# Patient Record
Sex: Male | Born: 1990
Health system: Southern US, Community
[De-identification: ages and names within clinical notes are randomized; demographics above are authoritative.]

## PROBLEM LIST (undated history)

## (undated) DIAGNOSIS — I10 Essential (primary) hypertension: Secondary | ICD-10-CM

---
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
## 2015-01-12 ENCOUNTER — Other Ambulatory Visit (HOSPITAL_COMMUNITY)
Admission: RE | Admit: 2015-01-12 | Discharge: 2015-01-12 | Disposition: A | Discharge status: Home / Self Care | Payer: 59 | Source: Ambulatory Visit | Attending: Internal Medicine | Admitting: Internal Medicine

## 2015-01-12 DIAGNOSIS — I10 Essential (primary) hypertension: Secondary | ICD-10-CM | POA: Insufficient documentation

## 2015-01-12 DIAGNOSIS — N183 Chronic kidney disease, stage 3 (moderate): Secondary | ICD-10-CM | POA: Insufficient documentation

## 2015-01-12 DIAGNOSIS — D751 Secondary polycythemia: Secondary | ICD-10-CM | POA: Insufficient documentation

## 2015-01-26 ENCOUNTER — Emergency Department (HOSPITAL_COMMUNITY): Payer: 59 | Admitting: Anesthesiology

## 2015-01-26 ENCOUNTER — Encounter (HOSPITAL_COMMUNITY)
Admission: EM | Disposition: A | Discharge status: Home / Self Care | Payer: Self-pay | Source: Home / Self Care | Attending: Emergency Medicine

## 2015-01-26 ENCOUNTER — Observation Stay (HOSPITAL_COMMUNITY)
Admission: EM | Admit: 2015-01-26 | Discharge: 2015-01-27 | Disposition: A | Discharge status: Home / Self Care | Payer: 59 | Attending: General Surgery | Admitting: General Surgery

## 2015-01-26 ENCOUNTER — Encounter (HOSPITAL_COMMUNITY): Payer: Self-pay | Admitting: Emergency Medicine

## 2015-01-26 ENCOUNTER — Emergency Department (HOSPITAL_COMMUNITY): Payer: 59

## 2015-01-26 DIAGNOSIS — N189 Chronic kidney disease, unspecified: Secondary | ICD-10-CM | POA: Diagnosis not present

## 2015-01-26 DIAGNOSIS — R1033 Periumbilical pain: Secondary | ICD-10-CM | POA: Diagnosis present

## 2015-01-26 DIAGNOSIS — K3580 Unspecified acute appendicitis: Secondary | ICD-10-CM | POA: Diagnosis not present

## 2015-01-26 DIAGNOSIS — I129 Hypertensive chronic kidney disease with stage 1 through stage 4 chronic kidney disease, or unspecified chronic kidney disease: Secondary | ICD-10-CM | POA: Insufficient documentation

## 2015-01-26 HISTORY — DX: Essential (primary) hypertension: I10

## 2015-01-26 HISTORY — PX: LAPAROSCOPIC APPENDECTOMY: SHX408

## 2015-01-26 LAB — BASIC METABOLIC PANEL
Anion gap: 9 (ref 5–15)
BUN: 12 mg/dL (ref 6–20)
CALCIUM: 9.9 mg/dL (ref 8.9–10.3)
CO2: 30 mmol/L (ref 22–32)
CREATININE: 1.69 mg/dL — AB (ref 0.61–1.24)
Chloride: 102 mmol/L (ref 101–111)
GFR calc Af Amer: >60 mL/min (ref >60)
GFR, EST NON AFRICAN AMERICAN: 55 mL/min — AB (ref >60)
GLUCOSE: 112 mg/dL — AB (ref 65–99)
Potassium: 4.2 mmol/L (ref 3.5–5.1)
Sodium: 141 mmol/L (ref 135–145)

## 2015-01-26 LAB — URINALYSIS, ROUTINE W REFLEX MICROSCOPIC
BILIRUBIN URINE: NEGATIVE
GLUCOSE, UA: NEGATIVE mg/dL
KETONES UR: NEGATIVE mg/dL
Leukocytes, UA: NEGATIVE
Nitrite: NEGATIVE
PH: 8.5 — AB (ref 5.0–8.0)
Protein, ur: >300 mg/dL — AB
Specific Gravity, Urine: 1.02 (ref 1.005–1.030)
Urobilinogen, UA: 0.2 mg/dL (ref 0.0–1.0)

## 2015-01-26 LAB — CBC WITH DIFFERENTIAL/PLATELET
BASOS ABS: 0 10*3/uL (ref 0.0–0.1)
Basophils Relative: 0 %
EOS PCT: 0 %
Eosinophils Absolute: 0 10*3/uL (ref 0.0–0.7)
HCT: 52.4 % — ABNORMAL HIGH (ref 39.0–52.0)
Hemoglobin: 18.4 g/dL — ABNORMAL HIGH (ref 13.0–17.0)
LYMPHS ABS: 1 10*3/uL (ref 0.7–4.0)
LYMPHS PCT: 8 %
MCH: 28 pg (ref 26.0–34.0)
MCHC: 35.1 g/dL (ref 30.0–36.0)
MCV: 79.6 fL (ref 78.0–100.0)
MONO ABS: 0.3 10*3/uL (ref 0.1–1.0)
Monocytes Relative: 3 %
Neutro Abs: 11 10*3/uL — ABNORMAL HIGH (ref 1.7–7.7)
Neutrophils Relative %: 89 %
PLATELETS: 243 10*3/uL (ref 150–400)
RBC: 6.58 MIL/uL — ABNORMAL HIGH (ref 4.22–5.81)
RDW: 14 % (ref 11.5–15.5)
WBC: 12.4 10*3/uL — ABNORMAL HIGH (ref 4.0–10.5)

## 2015-01-26 LAB — URINE MICROSCOPIC-ADD ON

## 2015-01-26 SURGERY — APPENDECTOMY, LAPAROSCOPIC
Anesthesia: General | Site: Abdomen

## 2015-01-26 MED ORDER — ROCURONIUM BROMIDE 50 MG/5ML IV SOLN
INTRAVENOUS | Status: AC
  Filled 2015-01-26: qty 1

## 2015-01-26 MED ORDER — POVIDONE-IODINE 10 % EX OINT
TOPICAL_OINTMENT | CUTANEOUS | Status: AC
  Filled 2015-01-26: qty 1

## 2015-01-26 MED ORDER — DEXAMETHASONE SODIUM PHOSPHATE 4 MG/ML IJ SOLN
INTRAMUSCULAR | Status: AC
  Filled 2015-01-26: qty 2

## 2015-01-26 MED ORDER — FENTANYL CITRATE (PF) 250 MCG/5ML IJ SOLN
INTRAMUSCULAR | Status: AC
  Filled 2015-01-26: qty 25

## 2015-01-26 MED ORDER — DIPHENHYDRAMINE HCL 50 MG/ML IJ SOLN: 12.5000 mg | Freq: Four times a day (QID) | INTRAMUSCULAR | Status: DC | PRN

## 2015-01-26 MED ORDER — MIDAZOLAM HCL 5 MG/5ML IJ SOLN
INTRAMUSCULAR | Status: DC | PRN
  Administered 2015-01-26: 2 mg via INTRAVENOUS

## 2015-01-26 MED ORDER — LACTATED RINGERS IV SOLN
INTRAVENOUS | Status: DC | PRN
  Administered 2015-01-26: 16:00:00 via INTRAVENOUS

## 2015-01-26 MED ORDER — PROPOFOL 10 MG/ML IV BOLUS
INTRAVENOUS | Status: AC
  Filled 2015-01-26: qty 20

## 2015-01-26 MED ORDER — LORAZEPAM 2 MG/ML IJ SOLN
1.0000 mg | INTRAMUSCULAR | Status: DC | PRN
  Administered 2015-01-27: 1 mg via INTRAVENOUS
  Filled 2015-01-26: qty 1

## 2015-01-26 MED ORDER — SUCCINYLCHOLINE CHLORIDE 20 MG/ML IJ SOLN
INTRAMUSCULAR | Status: DC | PRN
  Administered 2015-01-26: 140 mg via INTRAVENOUS

## 2015-01-26 MED ORDER — LACTATED RINGERS IV SOLN
INTRAVENOUS | Status: DC
  Administered 2015-01-26: via INTRAVENOUS

## 2015-01-26 MED ORDER — SODIUM CHLORIDE 0.9 % IR SOLN
Status: DC | PRN
  Administered 2015-01-26: 1000 mL

## 2015-01-26 MED ORDER — KETOROLAC TROMETHAMINE 30 MG/ML IJ SOLN
30.0000 mg | Freq: Once | INTRAMUSCULAR | Status: AC
  Administered 2015-01-26: 30 mg via INTRAVENOUS
  Filled 2015-01-26: qty 1

## 2015-01-26 MED ORDER — ROCURONIUM BROMIDE 100 MG/10ML IV SOLN
INTRAVENOUS | Status: DC | PRN
  Administered 2015-01-26: 10 mg via INTRAVENOUS
  Administered 2015-01-26: 15 mg via INTRAVENOUS
  Administered 2015-01-26: 5 mg via INTRAVENOUS

## 2015-01-26 MED ORDER — ACETAMINOPHEN 650 MG RE SUPP: 650.0000 mg | Freq: Four times a day (QID) | RECTAL | Status: DC | PRN

## 2015-01-26 MED ORDER — LIDOCAINE HCL (PF) 1 % IJ SOLN
INTRAMUSCULAR | Status: AC
  Filled 2015-01-26: qty 5

## 2015-01-26 MED ORDER — GLYCOPYRROLATE 0.2 MG/ML IJ SOLN
INTRAMUSCULAR | Status: AC
  Filled 2015-01-26: qty 3

## 2015-01-26 MED ORDER — DEXAMETHASONE SODIUM PHOSPHATE 4 MG/ML IJ SOLN
INTRAMUSCULAR | Status: DC | PRN
  Administered 2015-01-26: 8 mg via INTRAVENOUS

## 2015-01-26 MED ORDER — ONDANSETRON 4 MG PO TBDP: 4.0000 mg | ORAL_TABLET | Freq: Four times a day (QID) | ORAL | Status: DC | PRN

## 2015-01-26 MED ORDER — ACETAMINOPHEN 325 MG PO TABS: 650.0000 mg | ORAL_TABLET | Freq: Four times a day (QID) | ORAL | Status: DC | PRN

## 2015-01-26 MED ORDER — NEOSTIGMINE METHYLSULFATE 10 MG/10ML IV SOLN
INTRAVENOUS | Status: AC
  Filled 2015-01-26: qty 1

## 2015-01-26 MED ORDER — BUPIVACAINE HCL (PF) 0.5 % IJ SOLN
INTRAMUSCULAR | Status: AC
  Filled 2015-01-26: qty 30

## 2015-01-26 MED ORDER — BUPIVACAINE HCL (PF) 0.5 % IJ SOLN
INTRAMUSCULAR | Status: DC | PRN
  Administered 2015-01-26: 10 mL

## 2015-01-26 MED ORDER — POVIDONE-IODINE 10 % OINT PACKET
TOPICAL_OINTMENT | CUTANEOUS | Status: DC | PRN
  Administered 2015-01-26: 1 via TOPICAL

## 2015-01-26 MED ORDER — ONDANSETRON HCL 4 MG/2ML IJ SOLN: 4.0000 mg | Freq: Four times a day (QID) | INTRAMUSCULAR | Status: DC | PRN

## 2015-01-26 MED ORDER — OXYCODONE-ACETAMINOPHEN 5-325 MG PO TABS: 1.0000 | ORAL_TABLET | ORAL | Status: DC | PRN

## 2015-01-26 MED ORDER — NEOSTIGMINE METHYLSULFATE 10 MG/10ML IV SOLN
INTRAVENOUS | Status: DC | PRN
  Administered 2015-01-26: 4 mg via INTRAVENOUS

## 2015-01-26 MED ORDER — ONDANSETRON HCL 4 MG/2ML IJ SOLN
INTRAMUSCULAR | Status: DC | PRN
  Administered 2015-01-26: 4 mg via INTRAVENOUS

## 2015-01-26 MED ORDER — HYDRALAZINE HCL 20 MG/ML IJ SOLN: 10.0000 mg | INTRAMUSCULAR | Status: DC | PRN

## 2015-01-26 MED ORDER — ONDANSETRON HCL 4 MG/2ML IJ SOLN
INTRAMUSCULAR | Status: AC
Start: 2015-01-26 — End: 2015-01-26
  Filled 2015-01-26: qty 2

## 2015-01-26 MED ORDER — ENOXAPARIN SODIUM 40 MG/0.4ML ~~LOC~~ SOLN: 40.0000 mg | SUBCUTANEOUS | Status: DC

## 2015-01-26 MED ORDER — MIDAZOLAM HCL 2 MG/2ML IJ SOLN
INTRAMUSCULAR | Status: AC
  Filled 2015-01-26: qty 4

## 2015-01-26 MED ORDER — GLYCOPYRROLATE 0.2 MG/ML IJ SOLN
INTRAMUSCULAR | Status: DC | PRN
  Administered 2015-01-26: 0.2 mg via INTRAVENOUS
  Administered 2015-01-26: 0.6 mg via INTRAVENOUS

## 2015-01-26 MED ORDER — GLYCOPYRROLATE 0.2 MG/ML IJ SOLN
INTRAMUSCULAR | Status: AC
  Filled 2015-01-26: qty 1

## 2015-01-26 MED ORDER — ARTIFICIAL TEARS OP OINT
TOPICAL_OINTMENT | OPHTHALMIC | Status: AC
  Filled 2015-01-26: qty 3.5

## 2015-01-26 MED ORDER — FENTANYL CITRATE (PF) 100 MCG/2ML IJ SOLN
INTRAMUSCULAR | Status: DC | PRN
  Administered 2015-01-26 (×5): 50 ug via INTRAVENOUS

## 2015-01-26 MED ORDER — PIPERACILLIN-TAZOBACTAM 3.375 G IVPB 30 MIN
3.3750 g | Freq: Once | INTRAVENOUS | Status: AC
  Administered 2015-01-26: 3.375 g via INTRAVENOUS
  Filled 2015-01-26: qty 50

## 2015-01-26 MED ORDER — SODIUM CHLORIDE 0.9 % IV BOLUS (SEPSIS)
1000.0000 mL | Freq: Once | INTRAVENOUS | Status: AC
  Administered 2015-01-26: 1000 mL via INTRAVENOUS

## 2015-01-26 MED ORDER — HYDROMORPHONE HCL 1 MG/ML IJ SOLN
1.0000 mg | INTRAMUSCULAR | Status: DC | PRN
  Administered 2015-01-26: 1 mg via INTRAVENOUS
  Filled 2015-01-26: qty 1

## 2015-01-26 MED ORDER — LIDOCAINE HCL (CARDIAC) 20 MG/ML IV SOLN
INTRAVENOUS | Status: DC | PRN
  Administered 2015-01-26: 50 mg via INTRAVENOUS

## 2015-01-26 MED ORDER — PIPERACILLIN-TAZOBACTAM 3.375 G IVPB
3.3750 g | Freq: Three times a day (TID) | INTRAVENOUS | Status: DC
  Administered 2015-01-26 – 2015-01-27 (×2): 3.375 g via INTRAVENOUS
  Filled 2015-01-26 (×5): qty 50

## 2015-01-26 MED ORDER — ATROPINE SULFATE 0.4 MG/ML IJ SOLN
INTRAMUSCULAR | Status: AC
  Filled 2015-01-26: qty 1

## 2015-01-26 MED ORDER — SUCCINYLCHOLINE CHLORIDE 20 MG/ML IJ SOLN
INTRAMUSCULAR | Status: AC
  Filled 2015-01-26: qty 1

## 2015-01-26 MED ORDER — DIPHENHYDRAMINE HCL 12.5 MG/5ML PO ELIX: 12.5000 mg | ORAL_SOLUTION | Freq: Four times a day (QID) | ORAL | Status: DC | PRN

## 2015-01-26 MED ORDER — ATROPINE SULFATE 1 MG/ML IJ SOLN
INTRAMUSCULAR | Status: AC
  Filled 2015-01-26: qty 1

## 2015-01-26 SURGICAL SUPPLY — 48 items
BAG HAMPER (MISCELLANEOUS) ×2 IMPLANT
CHLORAPREP W/TINT 26ML (MISCELLANEOUS) ×2 IMPLANT
CLOTH BEACON ORANGE TIMEOUT ST (SAFETY) ×2 IMPLANT
COVER LIGHT HANDLE STERIS (MISCELLANEOUS) ×4 IMPLANT
CUTTER FLEX LINEAR 45M (STAPLE) IMPLANT
CUTTER LINEAR ENDO 35 ART FLEX (STAPLE) IMPLANT
CUTTER LINEAR ENDO 35 ART THIN (STAPLE) ×2 IMPLANT
CUTTER LINEAR ENDO 35 ETS TH (STAPLE) IMPLANT
DECANTER SPIKE VIAL GLASS SM (MISCELLANEOUS) ×2 IMPLANT
DISSECTOR BLUNT TIP ENDO 5MM (MISCELLANEOUS) IMPLANT
ELECT REM PT RETURN 9FT ADLT (ELECTROSURGICAL) ×2
ELECTRODE REM PT RTRN 9FT ADLT (ELECTROSURGICAL) ×1 IMPLANT
FILTER SMOKE EVAC LAPAROSHD (FILTER) ×2 IMPLANT
FORMALIN 10 PREFIL 120ML (MISCELLANEOUS) ×2 IMPLANT
GLOVE BIOGEL PI IND STRL 7.0 (GLOVE) ×2 IMPLANT
GLOVE BIOGEL PI INDICATOR 7.0 (GLOVE) ×2
GLOVE ECLIPSE 7.0 STRL STRAW (GLOVE) ×2 IMPLANT
GLOVE EXAM NITRILE MD LF STRL (GLOVE) ×2 IMPLANT
GLOVE SURG SS PI 7.5 STRL IVOR (GLOVE) ×4 IMPLANT
GOWN STRL REUS W/ TWL XL LVL3 (GOWN DISPOSABLE) ×1 IMPLANT
GOWN STRL REUS W/TWL LRG LVL3 (GOWN DISPOSABLE) ×2 IMPLANT
GOWN STRL REUS W/TWL XL LVL3 (GOWN DISPOSABLE) ×1
INST SET LAPROSCOPIC AP (KITS) ×2 IMPLANT
IV NS IRRIG 3000ML ARTHROMATIC (IV SOLUTION) IMPLANT
KIT ROOM TURNOVER APOR (KITS) ×2 IMPLANT
MANIFOLD NEPTUNE II (INSTRUMENTS) ×2 IMPLANT
NEEDLE INSUFFLATION 14GA 120MM (NEEDLE) ×2 IMPLANT
NS IRRIG 1000ML POUR BTL (IV SOLUTION) ×2 IMPLANT
PACK LAP CHOLE LZT030E (CUSTOM PROCEDURE TRAY) ×2 IMPLANT
PAD ARMBOARD 7.5X6 YLW CONV (MISCELLANEOUS) ×2 IMPLANT
POUCH SPECIMEN RETRIEVAL 10MM (ENDOMECHANICALS) ×2 IMPLANT
RELOAD /EVU35 (ENDOMECHANICALS) IMPLANT
RELOAD 45 VASCULAR/THIN (ENDOMECHANICALS) IMPLANT
RELOAD CUTTER ETS 35MM STAND (ENDOMECHANICALS) IMPLANT
SET BASIN LINEN APH (SET/KITS/TRAYS/PACK) ×2 IMPLANT
SET TUBE IRRIG SUCTION NO TIP (IRRIGATION / IRRIGATOR) IMPLANT
SHEARS HARMONIC ACE PLUS 36CM (ENDOMECHANICALS) ×2 IMPLANT
SPONGE GAUZE 2X2 8PLY STRL LF (GAUZE/BANDAGES/DRESSINGS) ×6 IMPLANT
STAPLER VISISTAT (STAPLE) ×2 IMPLANT
SUT VICRYL 0 UR6 27IN ABS (SUTURE) ×2 IMPLANT
TAPE CLOTH SURG 4X10 WHT LF (GAUZE/BANDAGES/DRESSINGS) ×2 IMPLANT
TRAY FOLEY CATH SILVER 16FR (SET/KITS/TRAYS/PACK) ×2 IMPLANT
TROCAR ENDO BLADELESS 11MM (ENDOMECHANICALS) ×2 IMPLANT
TROCAR ENDO BLADELESS 12MM (ENDOMECHANICALS) ×2 IMPLANT
TROCAR XCEL NON-BLD 5MMX100MML (ENDOMECHANICALS) ×2 IMPLANT
TUBING INSUFFLATION (TUBING) ×2 IMPLANT
WARMER LAPAROSCOPE (MISCELLANEOUS) ×2 IMPLANT
YANKAUER SUCT 12FT TUBE ARGYLE (SUCTIONS) ×2 IMPLANT

## 2015-01-26 NOTE — ED Provider Notes (Signed)
CSN: [id number]     Arrival date & time 01/26/15  1121 History   First MD Initiated Contact with Patient 01/26/15 1352     Chief Complaint  Patient presents with  . Abdominal Pain     (Consider location/radiation/quality/duration/timing/severity/associated sxs/prior Treatment) HPI Comments: The patient is a 24 year old male, he has a benign past medical history, he does not take any medications, he states that he does not take any prescriptions, according to the medical record he has been diagnosed with high blood pressure. He reports that he had abdominal pain that started last night, it is located just below the belly button and above the pelvic bone, it has been 10 out of 10 at the worst, currently a disease.slightly. He has been having normal bowel movements without constipation, diarrhea or bleeding. He has no difficulty urinating, he has no fevers chills nausea or vomiting. He denies prior abdominal surgery. This pain has been constant throughout the evening and this morning.  Patient is a 24 y.o. male presenting with abdominal pain. The history is provided by the patient.  Abdominal Pain   Past Medical History  Diagnosis Date  . Hypertension    History reviewed. No pertinent past surgical history. No family history on file. Social History  Substance Use Topics  . Smoking status: Never Smoker   . Smokeless tobacco: None  . Alcohol Use: 1.2 oz/week    2 Cans of beer per week     Comment: occasional    Review of Systems  Gastrointestinal: Positive for abdominal pain.  All other systems reviewed and are negative.     Allergies  Review of patient's allergies indicates no known allergies.  Home Medications   Prior to Admission medications   Medication Sig Start Date End Date Taking? Authorizing Provider  diphenhydrAMINE (BENADRYL) 25 MG tablet Take 25 mg by mouth every 6 (six) hours as needed.   Yes Historical Provider, MD  enalapril (VASOTEC) 20 MG tablet Take 40 mg by  mouth at bedtime.   Yes Historical Provider, MD   BP 164/97 mmHg  Pulse 70  Temp(Src) 97.3 F (36.3 C) (Oral)  Resp 20  Ht  (1.727 m)  Wt 201 lb (91.173 kg)  BMI 30.57 kg/m2  SpO2 100% Physical Exam  Constitutional: He appears well-developed and well-nourished. No distress.  HENT:  Head: Normocephalic and atraumatic.  Mouth/Throat: Oropharynx is clear and moist. No oropharyngeal exudate.  Eyes: Conjunctivae and EOM are normal. Pupils are equal, round, and reactive to light. Right eye exhibits no discharge. Left eye exhibits no discharge. No scleral icterus.  Neck: Normal range of motion. Neck supple. No JVD present. No thyromegaly present.  Cardiovascular: Normal rate, regular rhythm, normal heart sounds and intact distal pulses.  Exam reveals no gallop and no friction rub.   No murmur heard. Pulmonary/Chest: Effort normal and breath sounds normal. No respiratory distress. He has no wheezes. He has no rales.  Abdominal: Soft. Bowel sounds are normal. He exhibits no distension and no mass. There is tenderness (focal tenderness to palpation just below the belly button, no other abdominal tenderness, there is no guarding, no peritoneal signs).  Genitourinary:  Normal appearing genitalia, no hernias  Musculoskeletal: Normal range of motion. He exhibits no edema or tenderness.  Lymphadenopathy:    He has no cervical adenopathy.  Neurological: He is alert. Coordination normal.  Skin: Skin is warm and dry. No rash noted. No erythema.  Psychiatric: He has a normal mood and affect. His behavior is  normal.  Nursing note and vitals reviewed.   ED Course  Procedures (including critical care time) Labs Review Labs Reviewed  CBC WITH DIFFERENTIAL/PLATELET - Abnormal; Notable for the following:    WBC 12.4 (*)    RBC 6.58 (*)    Hemoglobin 18.4 (*)    HCT 52.4 (*)    Neutro Abs 11.0 (*)    All other components within normal limits  BASIC METABOLIC PANEL - Abnormal; Notable for the  following:    Glucose, Bld 112 (*)    Creatinine, Ser 1.69 (*)    GFR calc non Af Amer 55 (*)    All other components within normal limits  URINALYSIS, ROUTINE W REFLEX MICROSCOPIC (NOT AT [facility])    Imaging Review Ct Abdomen Pelvis Wo Contrast  01/26/2015  CLINICAL DATA:  Abdominal pain for 1 day EXAM: CT ABDOMEN AND PELVIS WITHOUT CONTRAST TECHNIQUE: Multidetector CT imaging of the abdomen and pelvis was performed following the standard protocol without oral or intravenous contrast material administration. COMPARISON:  None. FINDINGS: Lower chest:  Lung bases are clear. Hepatobiliary: No focal liver lesions are identified. The gallbladder wall is not appreciably thickened. There is no biliary duct dilatation. Pancreas: No pancreatic mass or inflammatory focus. Spleen: No splenic lesions appreciable. Adrenals/Urinary Tract: Adrenals appear normal bilaterally. There is no renal mass or hydronephrosis on either side. No renal or ureteral calculus is appreciable on either side. Slight nephrocalcinosis is noted bilaterally. Urinary bladder is midline with wall thickness within normal limits for degree of distention. Stomach/Bowel: There is no bowel wall or mesenteric thickening. No bowel obstruction. No free air or portal venous air. Vascular/Lymph Nodes colon There is no there is no abdominal aortic aneurysm. No vascular lesion is seen on this noncontrast enhanced study. There is no adenopathy in the abdomen or pelvis. Reproductive: Prostate is normal in size and contour. There is no pelvic mass. Other: There are several phleboliths in the appendix. The appendix is distended with a diameter of 14 mm. There is no periappendiceal abscess or mesenteric thickening. There is, however, a small amount of loculated effusion slightly distal to the appendix. Elsewhere no abscess is seen. Musculoskeletal: No blastic or lytic bone lesions. No intramuscular or abdominal wall lesions. IMPRESSION: There are phleboliths in  the appendix with a distended appendix, concerning for early acute appendicitis. A small amount of loculated fluid is seen in the right pelvis slightly inferior to the appendix. No frank abscess. No bowel obstruction.  No abscess elsewhere. No renal or ureteral calculi.  No hydronephrosis. Critical Value/emergent results were called by telephone at the time of interpretation on 01/26/2015 at 2:38 pm to Dr. Markham Jordanavid Zammit , who verbally acknowledged these results. Electronically Signed   By: Bretta BangWilliam  Woodruff III M.D.   On: 01/26/2015 14:38   I have personally reviewed and evaluated these images and lab results as part of my medical decision-making.    MDM   Final diagnoses:  Acute appendicitis, unspecified acute appendicitis type    The patient has mild hypertension, slight leukocytosis present - wound consider UTI vs appy as possiblities, proceed with CT.  CT shows appy,  Labs 2with elevated WBC,  Npo status  D/w General surgery.  Dr. Lovell SheehanJenkins will take to the operating room, Zosyn given  Eber HongBrian Cody Albus, MD 01/26/15 1500

## 2015-01-26 NOTE — H&P (Signed)
Ricardo [name] is an 24 y.o. male.   Chief Complaint: Abdominal pain HPI: Patient is a 23 year old black male who presents to the emergency room with less than a 24-hour history of worsening periumbilical pain. CT scan the abdomen was performed which revealed acute appendicitis. There was no evidence of perforation.  Past Medical History  Diagnosis Date  . Hypertension     History reviewed. No pertinent past surgical history.  History reviewed. No pertinent family history. Social History:  reports that he has never smoked. He does not have any smokeless tobacco history on file. He reports that he drinks about 1.2 oz of alcohol per week. He reports that he does not use illicit drugs.  Allergies: No Known Allergies  Medications Prior to Admission  Medication Sig Dispense Refill  . diphenhydrAMINE (BENADRYL) 25 MG tablet Take 25 mg by mouth every 6 (six) hours as needed.    . enalapril (VASOTEC) 20 MG tablet Take 40 mg by mouth at bedtime.      Results for orders placed or performed during the hospital encounter of 01/26/15 (from the past 48 hour(s))  CBC with Differential     Status: Abnormal   Collection Time: 01/26/15 12:31 PM  Result Value Ref Range   WBC 12.4 (H) 4.0 - 10.5 K/uL   RBC 6.58 (H) 4.22 - 5.81 MIL/uL   Hemoglobin 18.4 (H) 13.0 - 17.0 g/dL   HCT 52.4 (H) 39.0 - 52.0 %   MCV 79.6 78.0 - 100.0 fL   MCH 28.0 26.0 - 34.0 pg   MCHC 35.1 30.0 - 36.0 g/dL   RDW 14.0 11.5 - 15.5 %   Platelets 243 150 - 400 K/uL   Neutrophils Relative % 89 %   Neutro Abs 11.0 (H) 1.7 - 7.7 K/uL   Lymphocytes Relative 8 %   Lymphs Abs 1.0 0.7 - 4.0 K/uL   Monocytes Relative 3 %   Monocytes Absolute 0.3 0.1 - 1.0 K/uL   Eosinophils Relative 0 %   Eosinophils Absolute 0.0 0.0 - 0.7 K/uL   Basophils Relative 0 %   Basophils Absolute 0.0 0.0 - 0.1 K/uL  Basic metabolic panel     Status: Abnormal   Collection Time: 01/26/15 12:31 PM  Result Value Ref Range   Sodium 141 135 - 145 mmol/L   Potassium 4.2 3.5 - 5.1 mmol/L   Chloride 102 101 - 111 mmol/L   CO2 30 22 - 32 mmol/L   Glucose, Bld 112 (H) 65 - 99 mg/dL   BUN 12 6 - 20 mg/dL   Creatinine, Ser 1.69 (H) 0.61 - 1.24 mg/dL   Calcium 9.9 8.9 - 10.3 mg/dL   GFR calc non Af Amer 55 (L) >60 mL/min   GFR calc Af Amer >60 >60 mL/min    Comment: (NOTE) The eGFR has been calculated using the CKD EPI equation. This calculation has not been validated in all clinical situations. eGFR's persistently <60 mL/min signify possible Chronic Kidney Disease.    Anion gap 9 5 - 15  Urinalysis, Routine w reflex microscopic (not at [facility])     Status: Abnormal   Collection Time: 01/26/15  2:04 PM  Result Value Ref Range   Color, Urine YELLOW YELLOW   APPearance CLEAR CLEAR   Specific Gravity, Urine 1.020 1.005 - 1.030   pH 8.5 (H) 5.0 - 8.0   Glucose, UA NEGATIVE NEGATIVE mg/dL   Hgb urine dipstick TRACE (A) NEGATIVE   Bilirubin Urine NEGATIVE NEGATIVE   Ketones, ur  NEGATIVE NEGATIVE mg/dL   Protein, ur >300 (A) NEGATIVE mg/dL   Urobilinogen, UA 0.2 0.0 - 1.0 mg/dL   Nitrite NEGATIVE NEGATIVE   Leukocytes, UA NEGATIVE NEGATIVE  Urine microscopic-add on     Status: None   Collection Time: 01/26/15  2:04 PM  Result Value Ref Range   RBC / HPF 0-2 <3 RBC/hpf   Ct Abdomen Pelvis Wo Contrast  01/26/2015  CLINICAL DATA:  Abdominal pain for 1 day EXAM: CT ABDOMEN AND PELVIS WITHOUT CONTRAST TECHNIQUE: Multidetector CT imaging of the abdomen and pelvis was performed following the standard protocol without oral or intravenous contrast material administration. COMPARISON:  None. FINDINGS: Lower chest:  Lung bases are clear. Hepatobiliary: No focal liver lesions are identified. The gallbladder wall is not appreciably thickened. There is no biliary duct dilatation. Pancreas: No pancreatic mass or inflammatory focus. Spleen: No splenic lesions appreciable. Adrenals/Urinary Tract: Adrenals appear normal bilaterally. There is no renal mass or  hydronephrosis on either side. No renal or ureteral calculus is appreciable on either side. Slight nephrocalcinosis is noted bilaterally. Urinary bladder is midline with wall thickness within normal limits for degree of distention. Stomach/Bowel: There is no bowel wall or mesenteric thickening. No bowel obstruction. No free air or portal venous air. Vascular/Lymph Nodes colon There is no there is no abdominal aortic aneurysm. No vascular lesion is seen on this noncontrast enhanced study. There is no adenopathy in the abdomen or pelvis. Reproductive: Prostate is normal in size and contour. There is no pelvic mass. Other: There are several phleboliths in the appendix. The appendix is distended with a diameter of 14 mm. There is no periappendiceal abscess or mesenteric thickening. There is, however, a small amount of loculated effusion slightly distal to the appendix. Elsewhere no abscess is seen. Musculoskeletal: No blastic or lytic bone lesions. No intramuscular or abdominal wall lesions. IMPRESSION: There are phleboliths in the appendix with a distended appendix, concerning for early acute appendicitis. A small amount of loculated fluid is seen in the right pelvis slightly inferior to the appendix. No frank abscess. No bowel obstruction.  No abscess elsewhere. No renal or ureteral calculi.  No hydronephrosis. Critical Value/emergent results were called by telephone at the time of interpretation on 01/26/2015 at 2:38 pm to Dr. Tania Ade , who verbally acknowledged these results. Electronically Signed   By: Lowella Grip III M.D.   On: 01/26/2015 14:38    Review of Systems  Constitutional: Positive for malaise/fatigue.  HENT: Negative.   Eyes: Negative.   Respiratory: Negative.   Cardiovascular: Negative.   Gastrointestinal: Positive for abdominal pain.  Genitourinary: Negative.   Musculoskeletal: Negative.   Skin: Negative.     Blood pressure 146/97, pulse 78, temperature 97.8 F (36.6 C),  temperature source Oral, resp. rate 16, height 5' 8"  (1.727 m), weight 91.173 kg (201 lb), SpO2 100 %. Physical Exam  Vitals reviewed. Constitutional: He is oriented to person, place, and time. He appears well-developed and well-nourished.  HENT:  Head: Normocephalic and atraumatic.  Neck: Normal range of motion. Neck supple.  Cardiovascular: Normal rate, regular rhythm and normal heart sounds.   Respiratory: Effort normal and breath sounds normal.  GI: Soft. He exhibits no distension. There is tenderness.  Tender in the right lower quadrant to palpation. No rigidity noted.  Neurological: He is alert and oriented to person, place, and time.  Skin: Skin is warm and dry.     Assessment/Plan Impression: Acute appendicitis Plan: We'll take the patient to the operating  room for laparoscopic appendectomy. The risks and benefits of the procedure including bleeding, infection, and the possibility of an open procedure were fully explained to the patient, who gave informed consent.  Kennis Wissmann A 01/26/2015, 3:58 PM

## 2015-01-26 NOTE — Op Note (Signed)
Patient:  Ricardo [name]  DOB:  July 12, 1990  MRN:  [id number]   Preop Diagnosis:  Acute appendicitis  Postop Diagnosis:  Same  Procedure:  Laparoscopic appendectomy  Surgeon:  Franky MachoMark Raiquan Chandler, M.D.  Anes:  Gen. endotracheal  Indications:  Patient is a 24 year old black male who presents with a less than 24-hour history of worsening lower abdominal pain. CT scan the abdomen revealed acute appendicitis. The risks and benefits of the procedure including bleeding, infection, and the possibility of an open procedure were fully explained to the patient, who gave informed consent.  Procedure note:  The patient was placed the supine position. After induction of general endotracheal anesthesia, the abdomen was prepped and draped using the usual sterile technique with DuraPrep. Surgical site confirmation was performed.  A supraumbilical incision was made down to the fascia. A Veress needle was introduced into the abdominal cavity and confirmation of placement was done using the saline drop test. The abdomen was then insufflated to 16 mmHg pressure. An 11 mm trocar was introduced into the abdominal cavity under direct visualization without difficulty. The patient was placed in deeper Trendelenburg position and additional 12 mm trocar was placed the suprapubic region a 5 mm trocar was placed left lower quadrant region. The appendix was visualized and noted to be inflamed. There was no evidence of perforation. The base the appendix appeared normal. The mesoappendix was divided using the harmonic scalpel. A vascular Endo GIA was placed across the base the appendix and fired. The appendix was then removed using an Endo Catch bag. The staple line was inspected and noted to be within normal limits. All fluid and air were then evacuated from the abdominal cavity prior to removal of the trochars.  All wounds were irrigated normal saline. All wounds were injected with 0.5% Sensorcaine. The supraumbilical fascia as well  as suprapubic fascia were reapproximated using 0 Vicryl interrupted sutures. All skin incisions were closed using staples. Betadine ointment and dry sterile dressings were applied.  All tape and needle counts were correct at the end of the procedure. Patient was extubated in the operating room and transferred to PACU in stable condition.  Complications:  None  EBL:  Minimal  Specimen:  Appendix

## 2015-01-26 NOTE — Anesthesia Postprocedure Evaluation (Signed)
  Anesthesia Post-op Note  Patient: Ricardo PointerJared Ramaswamy  Procedure(s) Performed: Procedure(s): APPENDECTOMY LAPAROSCOPIC (N/A)  Patient Location: PACU  Anesthesia Type:General  Level of Consciousness: awake, alert , oriented and patient cooperative  Airway and Oxygen Therapy: Patient Spontanous Breathing and Patient connected to face mask oxygen  Post-op Pain: none  Post-op Assessment: Post-op Vital signs reviewed, Patient's Cardiovascular Status Stable, Respiratory Function Stable, Patent Airway, No signs of Nausea or vomiting and Pain level controlled              Post-op Vital Signs: Reviewed and stable  Last Vitals:  Filed Vitals:   01/26/15 1605  BP: 150/94  Pulse:   Temp:   Resp: 14    Complications: No apparent anesthesia complications

## 2015-01-26 NOTE — Anesthesia Preprocedure Evaluation (Addendum)
Anesthesia Evaluation  Patient identified by MRN, date of birth, ID band Patient awake    Reviewed: Allergy & Precautions, H&P , NPO status , Patient's Chart, lab work & pertinent test results  Airway Mallampati: II  TM Distance: >3 FB Neck ROM: full    Dental no notable dental hx.    Pulmonary    Pulmonary exam normal breath sounds clear to auscultation       Cardiovascular hypertension, Normal cardiovascular exam Rhythm:regular Rate:Normal     Neuro/Psych    GI/Hepatic Patient received Oral Contrast Agents,Acute appendicities   Endo/Other    Renal/GU      Musculoskeletal   Abdominal   Peds  Hematology   Anesthesia Other Findings   Reproductive/Obstetrics                            Anesthesia Physical Anesthesia Plan  ASA: II and emergent  Anesthesia Plan: Rapid Sequence, Cricoid Pressure and General ETT   Post-op Pain Management:    Induction: Intravenous, Rapid sequence and Cricoid pressure planned  Airway Management Planned: Oral ETT  Additional Equipment:   Intra-op Plan:   Post-operative Plan: Extubation in OR  Informed Consent: I have reviewed the patients History and Physical, chart, labs and discussed the procedure including the risks, benefits and alternatives for the proposed anesthesia with the patient or authorized representative who has indicated his/her understanding and acceptance.   Dental Advisory Given  Plan Discussed with: Surgeon  Anesthesia Plan Comments:        Anesthesia Quick Evaluation

## 2015-01-26 NOTE — ED Notes (Signed)
Having abdominal pain started this am.  Rates pain 10/10.  Last BM this am.  Stool was normal.

## 2015-01-26 NOTE — Transfer of Care (Signed)
Immediate Anesthesia Transfer of Care Note  Patient: Ricardo [name]  Procedure(s) Performed: Procedure(s): APPENDECTOMY LAPAROSCOPIC (N/A)  Patient Location: PACU  Anesthesia Type:General  Level of Consciousness: awake, oriented and patient cooperative  Airway & Oxygen Therapy: Patient Spontanous Breathing and Patient connected to face mask oxygen  Post-op Assessment: Report given to RN and Post -op Vital signs reviewed and stable  Post vital signs: Reviewed and stable  Last Vitals:  Filed Vitals:   01/26/15 1605  BP: 150/94  Pulse:   Temp:   Resp: 14    Complications: No apparent anesthesia complications

## 2015-01-26 NOTE — Anesthesia Procedure Notes (Signed)
Procedure Name: Intubation Date/Time: 01/26/2015 4:21 PM Performed by: Pernell DupreADAMS, Kalaya Infantino A Pre-anesthesia Checklist: Patient identified, Patient being monitored, Timeout performed, Emergency Drugs available and Suction available Patient Re-evaluated:Patient Re-evaluated prior to inductionOxygen Delivery Method: Circle System Utilized Preoxygenation: Pre-oxygenation with 100% oxygen Intubation Type: IV induction, Rapid sequence and Cricoid Pressure applied Ventilation: Mask ventilation without difficulty Laryngoscope Size: 3 and Miller Grade View: Grade I Tube type: Oral Tube size: 7.0 mm Number of attempts: 1 Airway Equipment and Method: Stylet Placement Confirmation: ETT inserted through vocal cords under direct vision,  positive ETCO2 and breath sounds checked- equal and bilateral Secured at: 21 cm Tube secured with: Tape Dental Injury: Teeth and Oropharynx as per pre-operative assessment

## 2015-01-27 LAB — BASIC METABOLIC PANEL
Anion gap: 7 (ref 5–15)
BUN: 13 mg/dL (ref 6–20)
CHLORIDE: 103 mmol/L (ref 101–111)
CO2: 28 mmol/L (ref 22–32)
Calcium: 9.3 mg/dL (ref 8.9–10.3)
Creatinine, Ser: 1.74 mg/dL — ABNORMAL HIGH (ref 0.61–1.24)
GFR calc non Af Amer: 53 mL/min — ABNORMAL LOW (ref >60)
Glucose, Bld: 107 mg/dL — ABNORMAL HIGH (ref 65–99)
POTASSIUM: 4.7 mmol/L (ref 3.5–5.1)
SODIUM: 138 mmol/L (ref 135–145)

## 2015-01-27 LAB — CBC
HEMATOCRIT: 50.1 % (ref 39.0–52.0)
HEMOGLOBIN: 17.5 g/dL — AB (ref 13.0–17.0)
MCH: 27.9 pg (ref 26.0–34.0)
MCHC: 34.9 g/dL (ref 30.0–36.0)
MCV: 79.9 fL (ref 78.0–100.0)
Platelets: 258 10*3/uL (ref 150–400)
RBC: 6.27 MIL/uL — AB (ref 4.22–5.81)
RDW: 13.9 % (ref 11.5–15.5)
WBC: 13.4 10*3/uL — ABNORMAL HIGH (ref 4.0–10.5)

## 2015-01-27 MED ORDER — OXYCODONE-ACETAMINOPHEN 7.5-325 MG PO TABS: 1.0000 | ORAL_TABLET | ORAL | Status: DC | PRN

## 2015-01-27 MED ORDER — PIPERACILLIN-TAZOBACTAM 3.375 G IVPB
INTRAVENOUS | Status: AC
  Filled 2015-01-27: qty 50

## 2015-01-27 NOTE — Discharge Summary (Signed)
Physician Discharge Summary  Patient ID: Ricardo [name] MRN: [id number] DOB/AGE: [birth year] 23 y.o.  Admit date: 01/26/2015 Discharge date: 01/27/2015  Admission Diagnoses: Acute appendicitis, chronic renal insufficiency  Discharge Diagnoses: Same Active Problems:   Acute appendicitis   Discharged Condition: good  Hospital Course: Patient is [name] 24 year old black male who presented to the emergency room on 01/26/2015 with worsening lower abdominal pain. CT scan of the abdomen revealed acute appendicitis. He underwent laparoscopic appendectomy on 01/26/2015. Tolerated the procedure well. His postoperative course was unremarkable. His diet was advanced without difficulty. The patient is being discharged home on 01/27/2015 in good and improving condition.  Treatments: surgery: Laparoscopic appendectomy on 01/26/2015  Discharge Exam: Blood pressure 128/79, pulse 68, temperature 98.4 F (36.9 C), temperature source Oral, resp. rate 14, height 5\' 8"  (1.727 m), weight 91.173 kg (201 lb), SpO2 98 %. General appearance: alert, cooperative and no distress Resp: clear to auscultation bilaterally Cardio: regular rate and rhythm, S1, S2 normal, no murmur, click, rub or gallop GI: Soft, dressings dry and intact.  Disposition: Home    Medication List    TAKE these medications        diphenhydrAMINE 25 MG tablet  Commonly known as:  BENADRYL  Take 25 mg by mouth every 6 (six) hours as needed.     enalapril 20 MG tablet  Commonly known as:  VASOTEC  Take 40 mg by mouth at bedtime.     oxyCODONE-acetaminophen 7.5-325 MG tablet  Commonly known as:  PERCOCET  Take 1-2 tablets by mouth every 4 (four) hours as needed.           Follow-up Information    Follow up with Dalia HeadingJENKINS,Dezyre Hoefer A, MD. Schedule an appointment as soon as possible for [name] visit on 02/03/2015.   Specialty:  General Surgery   Contact information:   [address] [place] [address] [phone]        Signed: [name],Ricardo [name] 01/27/2015, 7:32 AM

## 2015-01-27 NOTE — Progress Notes (Signed)
Milinda PointerJared Malmquist discharged Home per MD order.  Discharge instructions reviewed and discussed with the patient, all questions and concerns answered. Copy of instructions and scripts given to patient.    Medication List    TAKE these medications        diphenhydrAMINE 25 MG tablet  Commonly known as:  BENADRYL  Take 25 mg by mouth every 6 (six) hours as needed.     enalapril 20 MG tablet  Commonly known as:  VASOTEC  Take 40 mg by mouth at bedtime.     oxyCODONE-acetaminophen 7.5-325 MG tablet  Commonly known as:  PERCOCET  Take 1-2 tablets by mouth every 4 (four) hours as needed.        Patients skin is clean, dry and intact, no evidence of skin break down. IV site discontinued and catheter remains intact. Site without signs and symptoms of complications. Dressing and pressure applied.  Patient escorted to car by NT in a wheelchair,  no distress noted upon discharge.  Hoover BrunetteBonnie M Holbrook

## 2015-01-27 NOTE — Discharge Instructions (Signed)
Laparoscopic Appendectomy, Adult, Care After °Refer to this sheet in the next few weeks. These instructions provide you with information on caring for yourself after your procedure. Your caregiver may also give you more specific instructions. Your treatment has been planned according to current medical practices, but problems sometimes occur. Call your caregiver if you have any problems or questions after your procedure. °HOME CARE INSTRUCTIONS °· Do not drive while taking narcotic pain medicines. °· Use stool softener if you become constipated from your pain medicines. °· Change your bandages (dressings) as directed. °· Keep your wounds clean and dry. You may wash the wounds gently with soap and water. Gently pat the wounds dry with a clean towel. °· Do not take baths, swim, or use hot tubs for 10 days, or as instructed by your caregiver. °· Only take over-the-counter or prescription medicines for pain, discomfort, or fever as directed by your caregiver. °· You may continue your normal diet as directed. °· Do not lift more than 10 pounds (4.5 kg) or play contact sports for 3 weeks, or as directed. °· Slowly increase your activity after surgery. °· Take deep breaths to avoid getting a lung infection (pneumonia). °SEEK MEDICAL CARE IF: °· You have redness, swelling, or increasing pain in your wounds. °· You have pus coming from your wounds. °· You have drainage from a wound that lasts longer than 1 day. °· You notice a bad smell coming from the wounds or dressing. °· Your wound edges break open after stitches (sutures) have been removed. °· You notice increasing pain in the shoulders (shoulder strap areas) or near your shoulder blades. °· You develop dizzy episodes or fainting while standing. °· You develop shortness of breath. °· You develop persistent nausea or vomiting. °· You cannot control your bowel functions or lose your appetite. °· You develop diarrhea. °SEEK IMMEDIATE MEDICAL CARE IF:  °· You have a  fever. °· You develop a rash. °· You have difficulty breathing or sharp pains in your chest. °· You develop any reaction or side effects to medicines given. °MAKE SURE YOU: °· Understand these instructions. °· Will watch your condition. °· Will get help right away if you are not doing well or get worse. °  °This information is not intended to replace advice given to you by your health care provider. Make sure you discuss any questions you have with your health care provider. °  °Document Released: 03/07/2005 Document Revised: 07/22/2014 Document Reviewed: 08/25/2014 °Elsevier Interactive Patient Education ©2016 [employer] ° °

## 2015-01-27 NOTE — Plan of Care (Signed)
Problem: Pain Managment: Goal: General experience of comfort will improve Outcome: Progressing Pt states he is comfortable and does not require any pain medicine right now. He rates his pain at a 1.   Problem: Physical Regulation: Goal: Ability to maintain clinical measurements within normal limits will improve Outcome: Progressing Pt's blood pressures are improving. See flowsheet.  Goal: Will remain free from infection Outcome: Progressing Pt receiving iv antibiotics.   Problem: Skin Integrity: Goal: Risk for impaired skin integrity will decrease Outcome: Progressing Pt's operative sites are dry and intact. They have old drainage.   Problem: Tissue Perfusion: Goal: Risk factors for ineffective tissue perfusion will decrease Outcome: Progressing Pt is wearing SCDs.   Problem: Bowel/Gastric: Goal: Will not experience complications related to bowel motility Outcome: Not Progressing Awaiting pt's first bowel movement since surgery.

## 2015-01-28 ENCOUNTER — Encounter (HOSPITAL_COMMUNITY): Payer: Self-pay | Admitting: General Surgery

## 2015-01-30 LAB — BETA-2 MICROGLOBULIN, URINE

## 2016-05-18 IMAGING — CT CT ABD-PELV W/O CM
2 of 4 series · 16 of 46 positions shown, 18 images · non-contrast
Comparison: None.

CLINICAL DATA: Abdominal pain for 1 day

EXAM:
CT ABDOMEN AND PELVIS WITHOUT CONTRAST
TECHNIQUE: Multidetector CT imaging of the abdomen and pelvis was performed
following the standard protocol without oral or intravenous contrast
material administration.

[Series 2: abdomen/pelvis w/o contrast · axial · non-contrast · 0.70mm/px · z∈[-509,-89]mm · 13 of 98 slices shown, 15 images]
[im 7/98  soft-tissue]
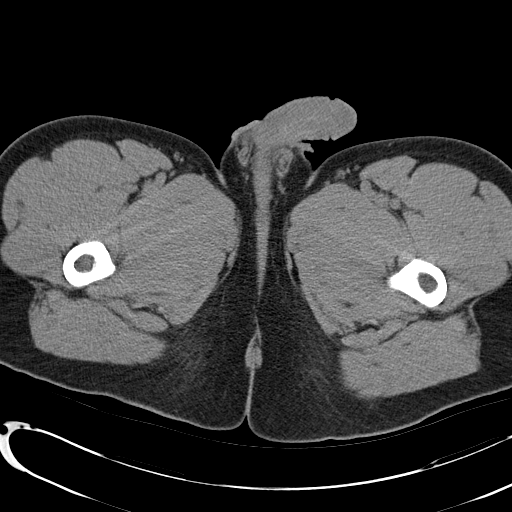
[im 7/98  bone]
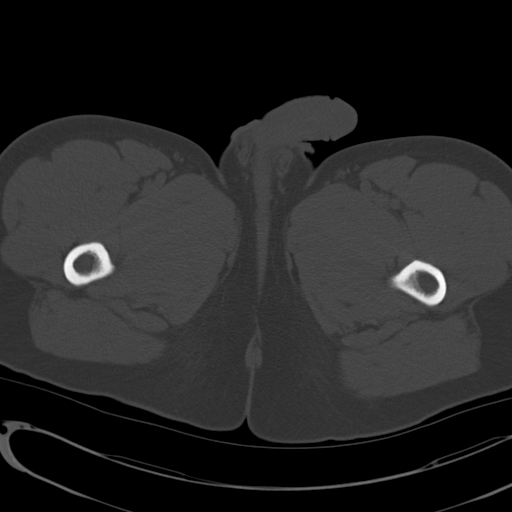
[im 13/98  soft-tissue]
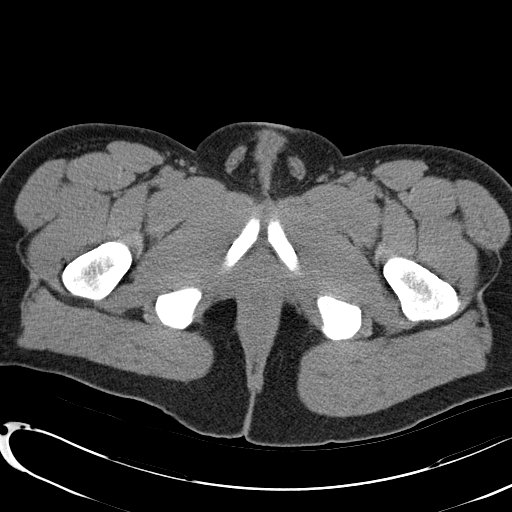
[im 20/98  soft-tissue]
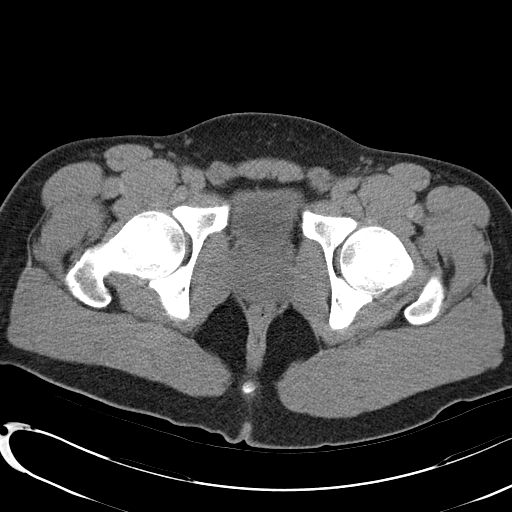
[im 26/98  soft-tissue]
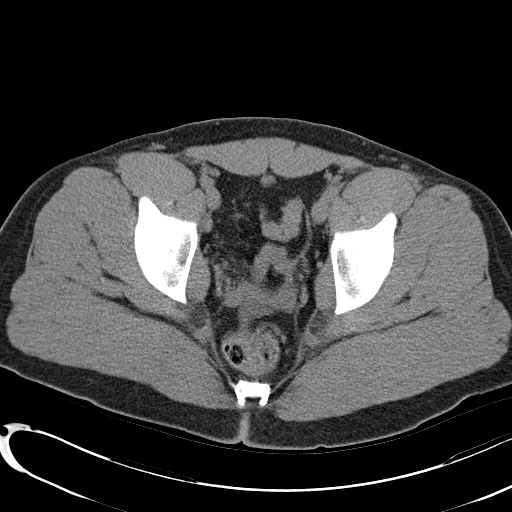
[im 33/98  soft-tissue]
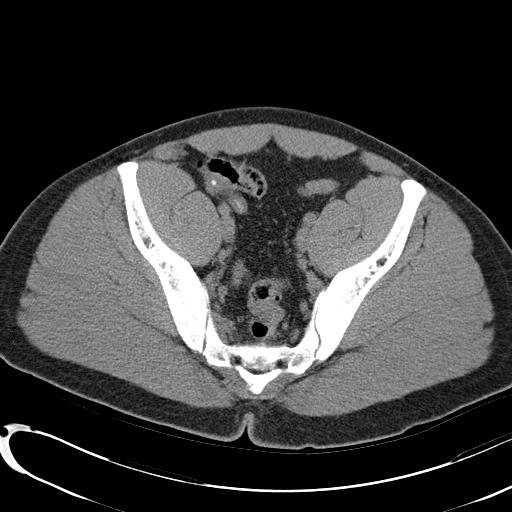
[im 39/98  soft-tissue]
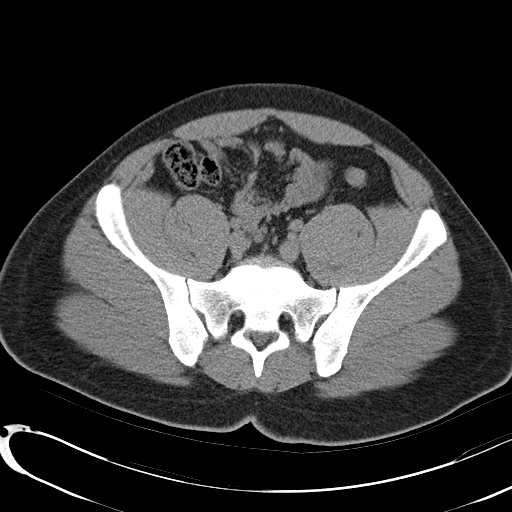
[im 52/98  soft-tissue]
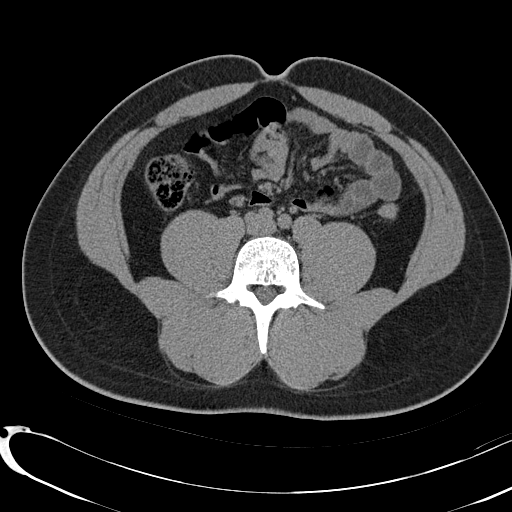
[im 59/98  soft-tissue]
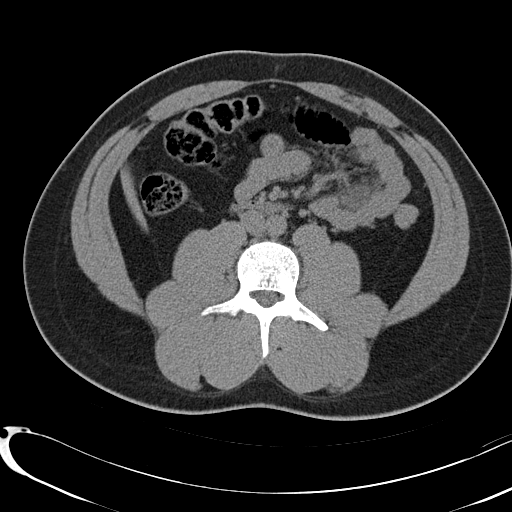
[im 65/98  soft-tissue]
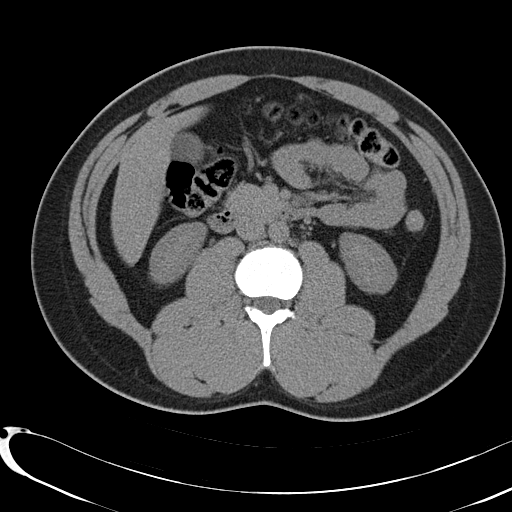
[im 65/98  bone]
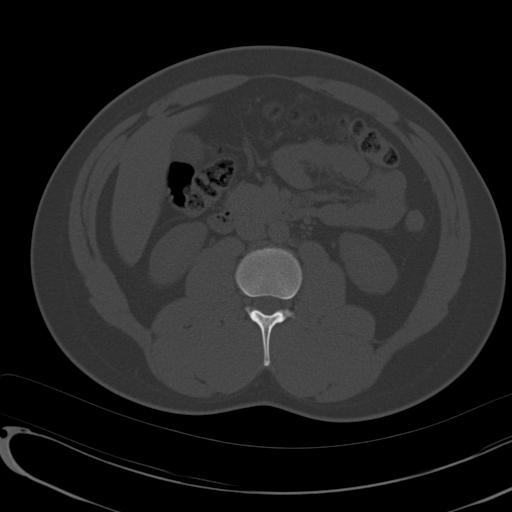
[im 72/98  soft-tissue]
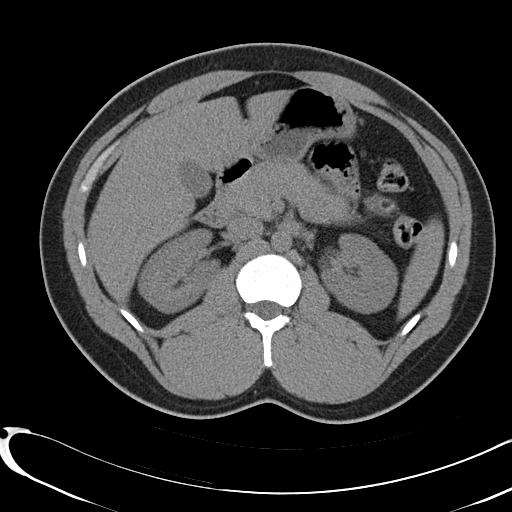
[im 78/98  soft-tissue]
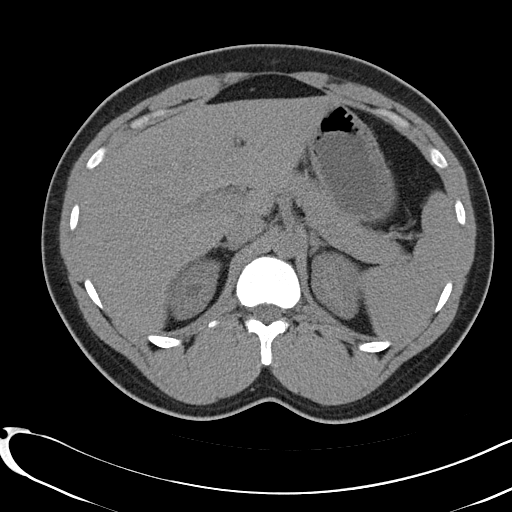
[im 85/98  soft-tissue]
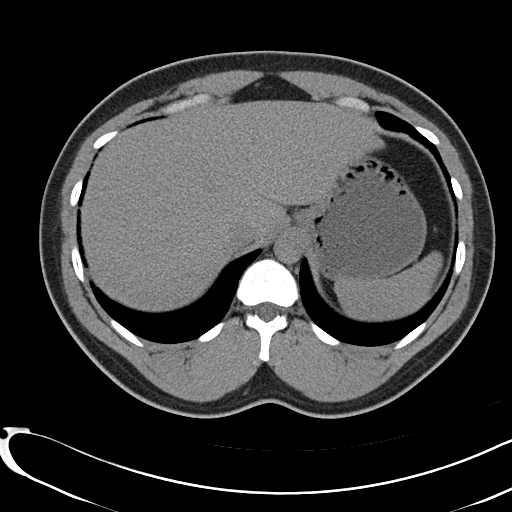
[im 91/98  soft-tissue]
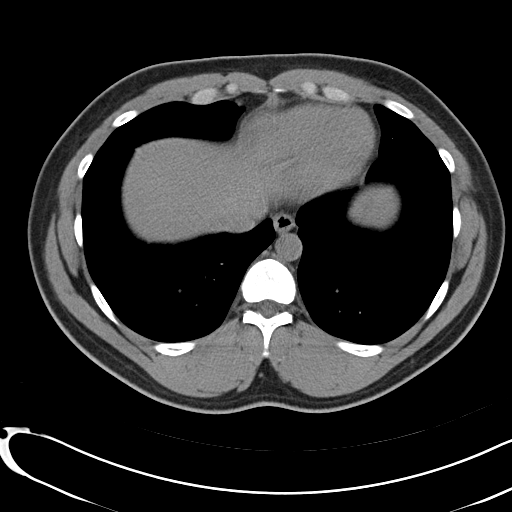

[Series 3: mpr cor (id) · coronal · 0.73mm/px · 3 of 102 slices shown]
[im 34/102  soft-tissue]
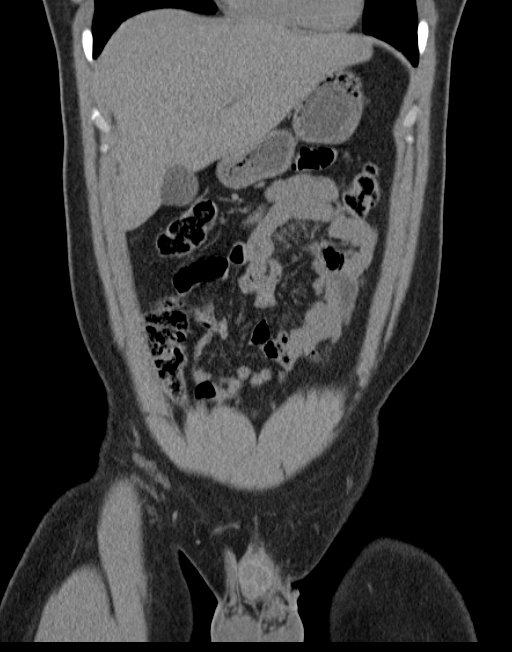
[im 45/102  soft-tissue]
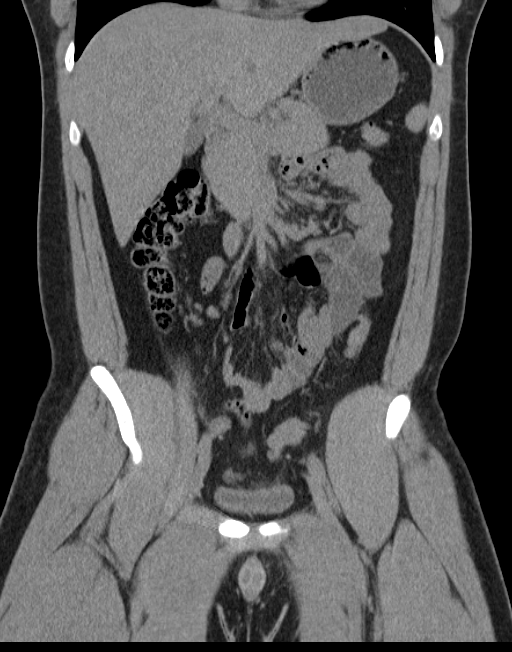
[im 57/102  soft-tissue]
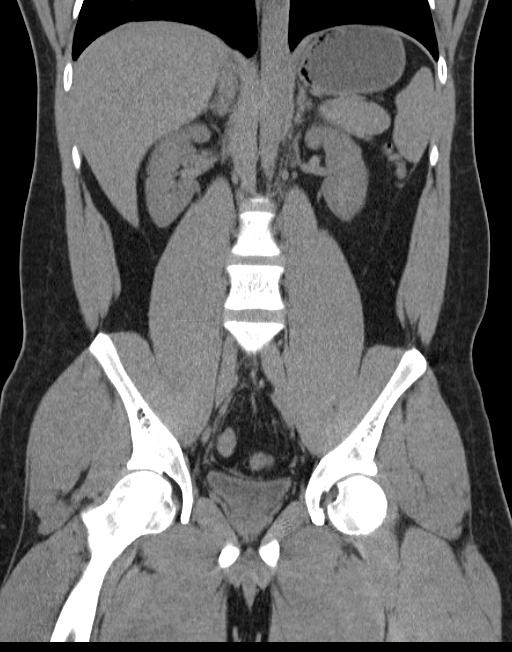

[16 of 46 positions shown; findings below may reference images not displayed]

FINDINGS: Lower chest:  Lung bases are clear.

Hepatobiliary: No focal liver lesions are identified. The
gallbladder wall is not appreciably thickened. There is no biliary
duct dilatation.

Pancreas: No pancreatic mass or inflammatory focus.

Spleen: No splenic lesions appreciable.

Adrenals/Urinary Tract: Adrenals appear normal bilaterally. There is
no renal mass or hydronephrosis on either side. No renal or ureteral
calculus is appreciable on either side. Slight nephrocalcinosis is
noted bilaterally. Urinary bladder is midline with wall thickness
within normal limits for degree of distention.

Stomach/Bowel: There is no bowel wall or mesenteric thickening. No
bowel obstruction. No free air or portal venous air.

Vascular/Lymph Nodes colon There is no there is no abdominal aortic
aneurysm. No vascular lesion is seen on this noncontrast enhanced
study. There is no adenopathy in the abdomen or pelvis.

Reproductive: Prostate is normal in size and contour. There is no
pelvic mass.

Other: There are several phleboliths in the appendix. The appendix
is distended with a diameter of 14 mm. There is no periappendiceal
abscess or mesenteric thickening. There is, however, a small amount
of loculated effusion slightly distal to the appendix. Elsewhere no
abscess is seen.

Musculoskeletal: No blastic or lytic bone lesions. No intramuscular
or abdominal wall lesions.
IMPRESSION: There are phleboliths in the appendix with a distended appendix,
concerning for early acute appendicitis. A small amount of loculated
fluid is seen in the right pelvis slightly inferior to the appendix.
No frank abscess.

No bowel obstruction.  No abscess elsewhere.

No renal or ureteral calculi.  No hydronephrosis.

Critical Value/emergent results were called by telephone at the time
of interpretation on 01/26/2015 at [DATE] to Dr. Mawu Salimu , who
verbally acknowledged these results.

## 2016-10-24 DIAGNOSIS — R809 Proteinuria, unspecified: Secondary | ICD-10-CM | POA: Diagnosis not present

## 2016-10-24 DIAGNOSIS — N189 Chronic kidney disease, unspecified: Secondary | ICD-10-CM | POA: Diagnosis not present

## 2016-10-24 DIAGNOSIS — E785 Hyperlipidemia, unspecified: Secondary | ICD-10-CM | POA: Diagnosis not present

## 2016-10-24 DIAGNOSIS — Z79899 Other long term (current) drug therapy: Secondary | ICD-10-CM | POA: Diagnosis not present

## 2016-10-24 DIAGNOSIS — D751 Secondary polycythemia: Secondary | ICD-10-CM | POA: Diagnosis not present

## 2016-10-24 DIAGNOSIS — I129 Hypertensive chronic kidney disease with stage 1 through stage 4 chronic kidney disease, or unspecified chronic kidney disease: Secondary | ICD-10-CM | POA: Diagnosis not present

## 2016-10-24 DIAGNOSIS — Q615 Medullary cystic kidney: Secondary | ICD-10-CM | POA: Diagnosis not present

## 2016-11-25 DIAGNOSIS — N183 Chronic kidney disease, stage 3 (moderate): Secondary | ICD-10-CM | POA: Diagnosis not present

## 2016-11-25 DIAGNOSIS — D751 Secondary polycythemia: Secondary | ICD-10-CM | POA: Diagnosis not present

## 2016-11-25 DIAGNOSIS — R809 Proteinuria, unspecified: Secondary | ICD-10-CM | POA: Diagnosis not present

## 2016-12-08 ENCOUNTER — Other Ambulatory Visit (HOSPITAL_COMMUNITY): Payer: Self-pay | Admitting: Nephrology

## 2016-12-08 DIAGNOSIS — N183 Chronic kidney disease, stage 3 unspecified: Secondary | ICD-10-CM

## 2016-12-22 ENCOUNTER — Ambulatory Visit (HOSPITAL_COMMUNITY): Admission: RE | Admit: 2016-12-22 | Payer: BLUE CROSS/BLUE SHIELD | Source: Ambulatory Visit

## 2016-12-26 DIAGNOSIS — D509 Iron deficiency anemia, unspecified: Secondary | ICD-10-CM | POA: Diagnosis not present

## 2016-12-26 DIAGNOSIS — E559 Vitamin D deficiency, unspecified: Secondary | ICD-10-CM | POA: Diagnosis not present

## 2016-12-26 DIAGNOSIS — R809 Proteinuria, unspecified: Secondary | ICD-10-CM | POA: Diagnosis not present

## 2016-12-26 DIAGNOSIS — Z79899 Other long term (current) drug therapy: Secondary | ICD-10-CM | POA: Diagnosis not present

## 2016-12-26 DIAGNOSIS — N183 Chronic kidney disease, stage 3 (moderate): Secondary | ICD-10-CM | POA: Diagnosis not present

## 2017-02-02 ENCOUNTER — Ambulatory Visit (HOSPITAL_COMMUNITY)
Admission: RE | Admit: 2017-02-02 | Discharge: 2017-02-02 | Disposition: A | Discharge status: Home / Self Care | Payer: BLUE CROSS/BLUE SHIELD | Source: Ambulatory Visit | Attending: Nephrology | Admitting: Nephrology

## 2017-02-02 DIAGNOSIS — N183 Chronic kidney disease, stage 3 unspecified: Secondary | ICD-10-CM

## 2017-02-02 DIAGNOSIS — N3289 Other specified disorders of bladder: Secondary | ICD-10-CM | POA: Diagnosis not present

## 2017-03-30 DIAGNOSIS — I129 Hypertensive chronic kidney disease with stage 1 through stage 4 chronic kidney disease, or unspecified chronic kidney disease: Secondary | ICD-10-CM | POA: Diagnosis not present

## 2017-04-03 DIAGNOSIS — I10 Essential (primary) hypertension: Secondary | ICD-10-CM | POA: Diagnosis not present

## 2017-04-03 DIAGNOSIS — N183 Chronic kidney disease, stage 3 (moderate): Secondary | ICD-10-CM | POA: Diagnosis not present

## 2017-04-03 DIAGNOSIS — Z79899 Other long term (current) drug therapy: Secondary | ICD-10-CM | POA: Diagnosis not present

## 2017-04-07 DIAGNOSIS — N183 Chronic kidney disease, stage 3 (moderate): Secondary | ICD-10-CM | POA: Diagnosis not present

## 2017-04-07 DIAGNOSIS — R809 Proteinuria, unspecified: Secondary | ICD-10-CM | POA: Diagnosis not present

## 2017-04-07 DIAGNOSIS — E559 Vitamin D deficiency, unspecified: Secondary | ICD-10-CM | POA: Diagnosis not present

## 2017-04-07 DIAGNOSIS — E875 Hyperkalemia: Secondary | ICD-10-CM | POA: Diagnosis not present

## 2017-04-21 DIAGNOSIS — E875 Hyperkalemia: Secondary | ICD-10-CM | POA: Diagnosis not present

## 2017-05-03 ENCOUNTER — Other Ambulatory Visit (HOSPITAL_COMMUNITY): Payer: Self-pay | Admitting: Nephrology

## 2017-05-03 DIAGNOSIS — R809 Proteinuria, unspecified: Secondary | ICD-10-CM

## 2017-05-26 ENCOUNTER — Other Ambulatory Visit: Payer: Self-pay | Admitting: Physician Assistant

## 2017-05-29 ENCOUNTER — Encounter (HOSPITAL_COMMUNITY): Payer: Self-pay

## 2017-05-29 ENCOUNTER — Ambulatory Visit (HOSPITAL_COMMUNITY)
Admission: RE | Admit: 2017-05-29 | Discharge: 2017-05-29 | Disposition: A | Discharge status: Home / Self Care | Payer: BLUE CROSS/BLUE SHIELD | Source: Ambulatory Visit | Attending: Nephrology | Admitting: Nephrology

## 2017-05-29 DIAGNOSIS — R809 Proteinuria, unspecified: Secondary | ICD-10-CM | POA: Diagnosis not present

## 2017-05-29 DIAGNOSIS — Z79899 Other long term (current) drug therapy: Secondary | ICD-10-CM | POA: Diagnosis not present

## 2017-05-29 DIAGNOSIS — I129 Hypertensive chronic kidney disease with stage 1 through stage 4 chronic kidney disease, or unspecified chronic kidney disease: Secondary | ICD-10-CM | POA: Diagnosis not present

## 2017-05-29 DIAGNOSIS — N183 Chronic kidney disease, stage 3 (moderate): Secondary | ICD-10-CM | POA: Insufficient documentation

## 2017-05-29 LAB — CBC
HEMATOCRIT: 49.5 % (ref 39.0–52.0)
Hemoglobin: 17.3 g/dL — ABNORMAL HIGH (ref 13.0–17.0)
MCH: 27.9 pg (ref 26.0–34.0)
MCHC: 34.9 g/dL (ref 30.0–36.0)
MCV: 79.7 fL (ref 78.0–100.0)
PLATELETS: 257 10*3/uL (ref 150–400)
RBC: 6.21 MIL/uL — AB (ref 4.22–5.81)
RDW: 14.2 % (ref 11.5–15.5)
WBC: 8 10*3/uL (ref 4.0–10.5)

## 2017-05-29 LAB — APTT: APTT: 31 s (ref 24–36)

## 2017-05-29 LAB — PROTIME-INR
INR: 0.92
Prothrombin Time: 12.3 seconds (ref 11.4–15.2)

## 2017-05-29 MED ORDER — HYDRALAZINE HCL 20 MG/ML IJ SOLN
INTRAMUSCULAR | Status: AC
  Filled 2017-05-29: qty 1

## 2017-05-29 MED ORDER — MIDAZOLAM HCL 2 MG/2ML IJ SOLN
INTRAMUSCULAR | Status: AC | PRN
  Administered 2017-05-29: 0.5 mg via INTRAVENOUS
  Administered 2017-05-29: 1 mg via INTRAVENOUS

## 2017-05-29 MED ORDER — HYDRALAZINE HCL 20 MG/ML IJ SOLN
INTRAMUSCULAR | Status: AC | PRN
  Administered 2017-05-29: 10 mg via INTRAVENOUS

## 2017-05-29 MED ORDER — LIDOCAINE HCL (PF) 1 % IJ SOLN
INTRAMUSCULAR | Status: AC
Start: 2017-05-29 — End: 2017-05-29
  Filled 2017-05-29: qty 30

## 2017-05-29 MED ORDER — SODIUM CHLORIDE 0.9 % IV SOLN: INTRAVENOUS | Status: DC

## 2017-05-29 MED ORDER — HYDROCODONE-ACETAMINOPHEN 5-325 MG PO TABS: 1.0000 | ORAL_TABLET | ORAL | Status: DC | PRN

## 2017-05-29 MED ORDER — MIDAZOLAM HCL 2 MG/2ML IJ SOLN
INTRAMUSCULAR | Status: AC
  Filled 2017-05-29: qty 2

## 2017-05-29 MED ORDER — FENTANYL CITRATE (PF) 100 MCG/2ML IJ SOLN
INTRAMUSCULAR | Status: AC | PRN
  Administered 2017-05-29 (×2): 50 ug via INTRAVENOUS

## 2017-05-29 MED ORDER — GELATIN ABSORBABLE 12-7 MM EX MISC
CUTANEOUS | Status: AC
  Filled 2017-05-29: qty 1

## 2017-05-29 MED ORDER — FENTANYL CITRATE (PF) 100 MCG/2ML IJ SOLN
INTRAMUSCULAR | Status: AC
  Filled 2017-05-29: qty 2

## 2017-05-29 NOTE — Procedures (Signed)
Interventional Radiology Procedure Note  Procedure: US guided left kidney biopsy  Complications: None  Estimated Blood Loss: < 10 mL  Findings: Both kidneys echogenic.  Left better visualized. 16 G core biopsy of LP cortex: 3 passes yielded 2 solid cores and one fragmented sample.  Jodi MarbleGlenn T. Fredia SorrowYamagata, M.D Pager:  [phone]

## 2017-05-29 NOTE — Sedation Documentation (Signed)
Patient is resting comfortably. 

## 2017-05-29 NOTE — Discharge Instructions (Signed)
Moderate Conscious Sedation, Adult, Care After °These instructions provide you with information about caring for yourself after your procedure. Your health care provider may also give you more specific instructions. Your treatment has been planned according to current medical practices, but problems sometimes occur. Call your health care provider if you have any problems or questions after your procedure. °What can I expect after the procedure? °After your procedure, it is common: °· To feel sleepy for several hours. °· To feel clumsy and have poor balance for several hours. °· To have poor judgment for several hours. °· To vomit if you eat too soon. ° °Follow these instructions at home: °For at least 24 hours after the procedure: ° °· Do not: °? Participate in activities where you could fall or become injured. °? Drive. °? Use heavy machinery. °? Drink alcohol. °? Take sleeping pills or medicines that cause drowsiness. °? Make important decisions or sign legal documents. °? Take care of children on your own. °· Rest. °Eating and drinking °· Follow the diet recommended by your health care provider. °· If you vomit: °? Drink water, juice, or soup when you can drink without vomiting. °? Make sure you have little or no nausea before eating solid foods. °General instructions °· Have a responsible adult stay with you until you are awake and alert. °· Take over-the-counter and prescription medicines only as told by your health care provider. °· If you smoke, do not smoke without supervision. °· Keep all follow-up visits as told by your health care provider. This is important. °Contact a health care provider if: °· You keep feeling nauseous or you keep vomiting. °· You feel light-headed. °· You develop a rash. °· You have a fever. °Get help right away if: °· You have trouble breathing. °This information is not intended to replace advice given to you by your health care provider. Make sure you discuss any questions you have  with your health care provider. °Document Released: 12/26/2012 Document Revised: 08/10/2015 Document Reviewed: 06/27/2015 °Elsevier Interactive Patient Education © 2018 [employer] Inc[employer] °Percutaneous Kidney Biopsy, Care After °This sheet gives you information about how to care for yourself after your procedure. Your health care provider may also give you more specific instructions. If you have problems or questions, contact your health care provider. °What can I expect after the procedure? °After the procedure, it is common to have: °· Pain or soreness near the area where the needle went through your skin (biopsy site). °· Bright pink or cloudy urine for 24 hours after the procedure. ° °Follow these instructions at home: °Activity °· Return to your normal activities as told by your health care provider. Ask your health care provider what activities are safe for you. °· Do not drive for 24 hours if you were given a medicine to help you relax (sedative). °· Do not lift anything that is heavier than 10 lb (4.5 kg) until your health care provider tells you that it is safe. °· Avoid activities that take a lot of effort (are strenuous) until your health care provider approves. Most people will have to wait 2 weeks before returning to activities such as exercise or sexual intercourse. °General instructions °· Take over-the-counter and prescription medicines only as told by your health care provider. °· You may eat and drink after your procedure. Follow instructions from your health care provider about eating or drinking restrictions. °· Check your biopsy site every day for signs of infection. Check for: °? More redness, swelling, or   pain. °? More fluid or blood. °? Warmth. °? Pus or a bad smell. °· Keep all follow-up visits as told by your health care provider. This is important. °Contact a health care provider if: °· You have more redness, swelling, or pain around your biopsy site. °· You have more fluid or blood coming from  your biopsy site. °· Your biopsy site feels warm to the touch. °· You have pus or a bad smell coming from your biopsy site. °· You have blood in your urine more than 24 hours after your procedure. °Get help right away if: °· You have dark red or brown urine. °· You have a fever. °· You are unable to urinate. °· You feel burning when you urinate. °· You feel faint. °· You have severe pain in your abdomen or side. °This information is not intended to replace advice given to you by your health care provider. Make sure you discuss any questions you have with your health care provider. °Document Released: 11/07/2012 Document Revised: 12/18/2015 Document Reviewed: 12/18/2015 °Elsevier Interactive Patient Education © 2018 Elsevier Inc. ° °

## 2017-05-29 NOTE — H&P (Signed)
Chief Complaint: Patient was seen in consultation today for random renal biopsy at the request of Befekadu,Belayenh  Referring Physician(s): [facility] Ladona Mow  Supervising Physician: Irish Lack  Patient Status: [facility] - Out-pt  History of Present Illness: Ricardo [name] is a 27 y.o. male   Hypertension Known CKD3 Rising creatinine Proteinuria  Scheduled for random renal biopsy   Past Medical History:  Diagnosis Date  . Hypertension     Past Surgical History:  Procedure Laterality Date  . LAPAROSCOPIC APPENDECTOMY N/A 01/26/2015   Procedure: APPENDECTOMY LAPAROSCOPIC;  Surgeon: Franky Macho, MD;  Location: [facility];  Service: General;  Laterality: N/A;    Allergies: Patient has no known allergies.  Medications: Prior to Admission medications   Medication Sig Start Date End Date Taking? Authorizing Provider  atorvastatin (LIPITOR) 10 MG tablet Take 10 mg by mouth daily.   Yes [provider]  enalapril (VASOTEC) 20 MG tablet Take 40 mg by mouth daily.    Yes [provider]  ergocalciferol (VITAMIN D2) 50000 units capsule Take 50,000 Units by mouth every Tuesday.   Yes [provider]  oxyCODONE-acetaminophen (PERCOCET) 7.5-325 MG tablet Take 1-2 tablets by mouth every 4 (four) hours as needed. Patient not taking: Reported on 05/18/2017 01/27/15   Franky Macho, MD     History reviewed. No pertinent family history.  Social History   Socioeconomic History  . Marital status: Single    Spouse name: None  . Number of children: None  . Years of education: None  . Highest education level: None  Social Needs  . Financial resource strain: None  . Food insecurity - worry: None  . Food insecurity - inability: None  . Transportation needs - medical: None  . Transportation needs - non-medical: None  Occupational History  . None  Tobacco Use  . Smoking status: Never Smoker  Substance and Sexual Activity  . Alcohol use: Yes      Alcohol/week: 1.2 oz    Types: 2 Cans of beer per week    Comment: occasional  . Drug use: No  . Sexual activity: Yes  Other Topics Concern  . None  Social History Narrative  . None    Review of Systems: A 12 point ROS discussed and pertinent positives are indicated in the HPI above.  All other systems are negative.  Review of Systems  Constitutional: Negative for activity change, appetite change, fatigue and fever.  Respiratory: Negative for cough and shortness of breath.   Cardiovascular: Negative for chest pain.  Gastrointestinal: Negative for abdominal pain.  Musculoskeletal: Negative for back pain and gait problem.  Neurological: Negative for weakness.  Psychiatric/Behavioral: Negative for behavioral problems and confusion.    Vital Signs: BP (!) 168/97   Pulse 85   Temp 98.2 F (36.8 C)   Resp 18   Ht 5\' 9"  (1.753 m)   Wt 193 lb (87.5 kg)   SpO2 100%   BMI 28.50 kg/m   Physical Exam  Constitutional: He is oriented to person, place, and time.  Cardiovascular: Normal rate, regular rhythm and normal heart sounds.  Pulmonary/Chest: Effort normal and breath sounds normal.  Abdominal: Soft. Bowel sounds are normal.  Musculoskeletal: Normal range of motion.  Neurological: He is alert and oriented to person, place, and time.  Skin: Skin is warm and dry.  Psychiatric: He has a normal mood and affect. His behavior is normal. Judgment and thought content normal.  Nursing note and vitals reviewed.   Imaging: No  results found.  Labs:  CBC: Recent Labs    05/29/17 0700  WBC 8.0  HGB 17.3*  HCT 49.5  PLT 257    COAGS: No results for input(s): INR, APTT in the last 8760 hours.  BMP: No results for input(s): NA, K, CL, CO2, GLUCOSE, BUN, CALCIUM, CREATININE, GFRNONAA, GFRAA in the last 8760 hours.  Invalid input(s): CMP  LIVER FUNCTION TESTS: No results for input(s): BILITOT, AST, ALT, ALKPHOS, PROT, ALBUMIN in the last 8760 hours.  TUMOR MARKERS: No  results for input(s): AFPTM, CEA, CA199, CHROMGRNA in the last 8760 hours.  Assessment and Plan:  Chronic Kidney disease 3 Rising creatinine Proterinuria Hypertension Scheduled for random renal biopsy Risks and benefits discussed with the patient including, but not limited to bleeding, infection, damage to adjacent structures or low yield requiring additional tests.  All of the patient's questions were answered, patient is agreeable to proceed. Consent signed and in chart.    Thank you for this interesting consult.  I greatly enjoyed meeting Milinda PointerJared Provencher and look forward to participating in their care.  A copy of this report was sent to the requesting provider on this date.  Electronically Signed: Robet LeuURPIN,Jontrell Bushong A, PA-C 05/29/2017, 7:43 AM   I spent a total of  30 Minutes   in face to face in clinical consultation, greater than 50% of which was counseling/coordinating care for random renal bx

## 2017-06-13 ENCOUNTER — Encounter (HOSPITAL_COMMUNITY): Payer: Self-pay | Admitting: Nephrology

## 2017-06-14 DIAGNOSIS — E559 Vitamin D deficiency, unspecified: Secondary | ICD-10-CM | POA: Diagnosis not present

## 2017-06-14 DIAGNOSIS — I10 Essential (primary) hypertension: Secondary | ICD-10-CM | POA: Diagnosis not present

## 2017-06-14 DIAGNOSIS — Z79899 Other long term (current) drug therapy: Secondary | ICD-10-CM | POA: Diagnosis not present

## 2017-06-14 DIAGNOSIS — N183 Chronic kidney disease, stage 3 (moderate): Secondary | ICD-10-CM | POA: Diagnosis not present

## 2017-06-14 DIAGNOSIS — R809 Proteinuria, unspecified: Secondary | ICD-10-CM | POA: Diagnosis not present

## 2017-06-21 DIAGNOSIS — E875 Hyperkalemia: Secondary | ICD-10-CM | POA: Diagnosis not present

## 2017-06-21 DIAGNOSIS — R809 Proteinuria, unspecified: Secondary | ICD-10-CM | POA: Diagnosis not present

## 2017-06-21 DIAGNOSIS — N183 Chronic kidney disease, stage 3 (moderate): Secondary | ICD-10-CM | POA: Diagnosis not present

## 2017-06-23 DIAGNOSIS — Z6827 Body mass index (BMI) 27.0-27.9, adult: Secondary | ICD-10-CM | POA: Diagnosis not present

## 2017-06-23 DIAGNOSIS — E785 Hyperlipidemia, unspecified: Secondary | ICD-10-CM | POA: Diagnosis not present

## 2017-06-23 DIAGNOSIS — I129 Hypertensive chronic kidney disease with stage 1 through stage 4 chronic kidney disease, or unspecified chronic kidney disease: Secondary | ICD-10-CM | POA: Diagnosis not present

## 2017-06-29 DIAGNOSIS — I10 Essential (primary) hypertension: Secondary | ICD-10-CM | POA: Diagnosis not present

## 2017-06-29 DIAGNOSIS — Z6828 Body mass index (BMI) 28.0-28.9, adult: Secondary | ICD-10-CM | POA: Diagnosis not present

## 2017-06-29 DIAGNOSIS — Z0001 Encounter for general adult medical examination with abnormal findings: Secondary | ICD-10-CM | POA: Diagnosis not present

## 2017-06-29 DIAGNOSIS — I129 Hypertensive chronic kidney disease with stage 1 through stage 4 chronic kidney disease, or unspecified chronic kidney disease: Secondary | ICD-10-CM | POA: Diagnosis not present

## 2017-07-31 DIAGNOSIS — N189 Chronic kidney disease, unspecified: Secondary | ICD-10-CM | POA: Diagnosis not present

## 2017-07-31 DIAGNOSIS — N183 Chronic kidney disease, stage 3 (moderate): Secondary | ICD-10-CM | POA: Diagnosis not present

## 2017-07-31 DIAGNOSIS — Z9114 Patient's other noncompliance with medication regimen: Secondary | ICD-10-CM | POA: Diagnosis not present

## 2017-07-31 DIAGNOSIS — I129 Hypertensive chronic kidney disease with stage 1 through stage 4 chronic kidney disease, or unspecified chronic kidney disease: Secondary | ICD-10-CM | POA: Diagnosis not present

## 2017-07-31 DIAGNOSIS — N159 Renal tubulo-interstitial disease, unspecified: Secondary | ICD-10-CM | POA: Diagnosis not present

## 2017-07-31 DIAGNOSIS — E785 Hyperlipidemia, unspecified: Secondary | ICD-10-CM | POA: Diagnosis not present

## 2017-08-31 DIAGNOSIS — E785 Hyperlipidemia, unspecified: Secondary | ICD-10-CM | POA: Diagnosis not present

## 2017-08-31 DIAGNOSIS — Z713 Dietary counseling and surveillance: Secondary | ICD-10-CM | POA: Diagnosis not present

## 2017-08-31 DIAGNOSIS — N189 Chronic kidney disease, unspecified: Secondary | ICD-10-CM | POA: Diagnosis not present

## 2017-08-31 DIAGNOSIS — Z008 Encounter for other general examination: Secondary | ICD-10-CM | POA: Diagnosis not present

## 2017-10-06 DIAGNOSIS — R809 Proteinuria, unspecified: Secondary | ICD-10-CM | POA: Diagnosis not present

## 2017-10-06 DIAGNOSIS — I10 Essential (primary) hypertension: Secondary | ICD-10-CM | POA: Diagnosis not present

## 2017-10-06 DIAGNOSIS — D509 Iron deficiency anemia, unspecified: Secondary | ICD-10-CM | POA: Diagnosis not present

## 2017-10-06 DIAGNOSIS — N183 Chronic kidney disease, stage 3 (moderate): Secondary | ICD-10-CM | POA: Diagnosis not present

## 2017-10-06 DIAGNOSIS — E559 Vitamin D deficiency, unspecified: Secondary | ICD-10-CM | POA: Diagnosis not present

## 2017-10-11 DIAGNOSIS — N183 Chronic kidney disease, stage 3 (moderate): Secondary | ICD-10-CM | POA: Diagnosis not present

## 2017-10-11 DIAGNOSIS — E875 Hyperkalemia: Secondary | ICD-10-CM | POA: Diagnosis not present

## 2017-10-11 DIAGNOSIS — R809 Proteinuria, unspecified: Secondary | ICD-10-CM | POA: Diagnosis not present

## 2017-10-11 DIAGNOSIS — E559 Vitamin D deficiency, unspecified: Secondary | ICD-10-CM | POA: Diagnosis not present

## 2018-01-02 DIAGNOSIS — E785 Hyperlipidemia, unspecified: Secondary | ICD-10-CM | POA: Diagnosis not present

## 2018-01-02 DIAGNOSIS — I10 Essential (primary) hypertension: Secondary | ICD-10-CM | POA: Diagnosis not present

## 2018-01-02 DIAGNOSIS — I129 Hypertensive chronic kidney disease with stage 1 through stage 4 chronic kidney disease, or unspecified chronic kidney disease: Secondary | ICD-10-CM | POA: Diagnosis not present

## 2018-01-02 DIAGNOSIS — Z6827 Body mass index (BMI) 27.0-27.9, adult: Secondary | ICD-10-CM | POA: Diagnosis not present

## 2018-01-02 DIAGNOSIS — Z6828 Body mass index (BMI) 28.0-28.9, adult: Secondary | ICD-10-CM | POA: Diagnosis not present

## 2018-01-04 DIAGNOSIS — Z6826 Body mass index (BMI) 26.0-26.9, adult: Secondary | ICD-10-CM | POA: Diagnosis not present

## 2018-01-04 DIAGNOSIS — I129 Hypertensive chronic kidney disease with stage 1 through stage 4 chronic kidney disease, or unspecified chronic kidney disease: Secondary | ICD-10-CM | POA: Diagnosis not present

## 2018-01-04 DIAGNOSIS — E781 Pure hyperglyceridemia: Secondary | ICD-10-CM | POA: Diagnosis not present

## 2018-01-04 DIAGNOSIS — I10 Essential (primary) hypertension: Secondary | ICD-10-CM | POA: Diagnosis not present

## 2018-01-19 DIAGNOSIS — N183 Chronic kidney disease, stage 3 (moderate): Secondary | ICD-10-CM | POA: Diagnosis not present

## 2018-01-19 DIAGNOSIS — R809 Proteinuria, unspecified: Secondary | ICD-10-CM | POA: Diagnosis not present

## 2018-01-19 DIAGNOSIS — Z79899 Other long term (current) drug therapy: Secondary | ICD-10-CM | POA: Diagnosis not present

## 2018-01-19 DIAGNOSIS — I10 Essential (primary) hypertension: Secondary | ICD-10-CM | POA: Diagnosis not present

## 2018-01-19 DIAGNOSIS — E559 Vitamin D deficiency, unspecified: Secondary | ICD-10-CM | POA: Diagnosis not present

## 2018-01-24 DIAGNOSIS — E559 Vitamin D deficiency, unspecified: Secondary | ICD-10-CM | POA: Diagnosis not present

## 2018-01-24 DIAGNOSIS — R809 Proteinuria, unspecified: Secondary | ICD-10-CM | POA: Diagnosis not present

## 2018-01-24 DIAGNOSIS — E875 Hyperkalemia: Secondary | ICD-10-CM | POA: Diagnosis not present

## 2018-01-24 DIAGNOSIS — N183 Chronic kidney disease, stage 3 (moderate): Secondary | ICD-10-CM | POA: Diagnosis not present

## 2018-05-04 DIAGNOSIS — D509 Iron deficiency anemia, unspecified: Secondary | ICD-10-CM | POA: Diagnosis not present

## 2018-05-04 DIAGNOSIS — I10 Essential (primary) hypertension: Secondary | ICD-10-CM | POA: Diagnosis not present

## 2018-05-04 DIAGNOSIS — N183 Chronic kidney disease, stage 3 (moderate): Secondary | ICD-10-CM | POA: Diagnosis not present

## 2018-05-04 DIAGNOSIS — Z79899 Other long term (current) drug therapy: Secondary | ICD-10-CM | POA: Diagnosis not present

## 2018-05-04 DIAGNOSIS — E559 Vitamin D deficiency, unspecified: Secondary | ICD-10-CM | POA: Diagnosis not present

## 2018-05-04 DIAGNOSIS — R809 Proteinuria, unspecified: Secondary | ICD-10-CM | POA: Diagnosis not present

## 2018-05-09 DIAGNOSIS — N183 Chronic kidney disease, stage 3 (moderate): Secondary | ICD-10-CM | POA: Diagnosis not present

## 2018-05-09 DIAGNOSIS — R809 Proteinuria, unspecified: Secondary | ICD-10-CM | POA: Diagnosis not present

## 2018-05-09 DIAGNOSIS — E559 Vitamin D deficiency, unspecified: Secondary | ICD-10-CM | POA: Diagnosis not present

## 2018-05-24 DIAGNOSIS — J060 Acute laryngopharyngitis: Secondary | ICD-10-CM | POA: Diagnosis not present

## 2018-05-24 DIAGNOSIS — J219 Acute bronchiolitis, unspecified: Secondary | ICD-10-CM | POA: Diagnosis not present

## 2018-06-28 DIAGNOSIS — E785 Hyperlipidemia, unspecified: Secondary | ICD-10-CM | POA: Diagnosis not present

## 2018-06-28 DIAGNOSIS — I129 Hypertensive chronic kidney disease with stage 1 through stage 4 chronic kidney disease, or unspecified chronic kidney disease: Secondary | ICD-10-CM | POA: Diagnosis not present

## 2018-06-28 DIAGNOSIS — I10 Essential (primary) hypertension: Secondary | ICD-10-CM | POA: Diagnosis not present

## 2018-07-05 DIAGNOSIS — E781 Pure hyperglyceridemia: Secondary | ICD-10-CM | POA: Diagnosis not present

## 2018-07-05 DIAGNOSIS — I10 Essential (primary) hypertension: Secondary | ICD-10-CM | POA: Diagnosis not present

## 2018-07-05 DIAGNOSIS — N189 Chronic kidney disease, unspecified: Secondary | ICD-10-CM | POA: Diagnosis not present

## 2018-08-10 DIAGNOSIS — D649 Anemia, unspecified: Secondary | ICD-10-CM | POA: Diagnosis not present

## 2018-08-10 DIAGNOSIS — R809 Proteinuria, unspecified: Secondary | ICD-10-CM | POA: Diagnosis not present

## 2018-08-10 DIAGNOSIS — Z79899 Other long term (current) drug therapy: Secondary | ICD-10-CM | POA: Diagnosis not present

## 2018-08-10 DIAGNOSIS — N183 Chronic kidney disease, stage 3 (moderate): Secondary | ICD-10-CM | POA: Diagnosis not present

## 2018-08-10 DIAGNOSIS — E559 Vitamin D deficiency, unspecified: Secondary | ICD-10-CM | POA: Diagnosis not present

## 2018-08-10 DIAGNOSIS — I10 Essential (primary) hypertension: Secondary | ICD-10-CM | POA: Diagnosis not present

## 2018-08-15 DIAGNOSIS — N041 Nephrotic syndrome with focal and segmental glomerular lesions: Secondary | ICD-10-CM | POA: Diagnosis not present

## 2018-08-15 DIAGNOSIS — I10 Essential (primary) hypertension: Secondary | ICD-10-CM | POA: Diagnosis not present

## 2018-08-15 DIAGNOSIS — N183 Chronic kidney disease, stage 3 (moderate): Secondary | ICD-10-CM | POA: Diagnosis not present

## 2019-01-04 DIAGNOSIS — E785 Hyperlipidemia, unspecified: Secondary | ICD-10-CM | POA: Diagnosis not present

## 2019-01-04 DIAGNOSIS — I10 Essential (primary) hypertension: Secondary | ICD-10-CM | POA: Diagnosis not present

## 2019-01-18 DIAGNOSIS — E781 Pure hyperglyceridemia: Secondary | ICD-10-CM | POA: Diagnosis not present

## 2019-01-18 DIAGNOSIS — N1831 Chronic kidney disease, stage 3a: Secondary | ICD-10-CM | POA: Diagnosis not present

## 2019-01-18 DIAGNOSIS — I10 Essential (primary) hypertension: Secondary | ICD-10-CM | POA: Diagnosis not present

## 2019-03-02 IMAGING — US US RENAL
1 series · 14 of 25 positions shown · non-contrast
Comparison: Noncontrast abdominopelvic CT scan January 26, 2015
and renal ultrasound February 27, 2008

CLINICAL DATA: Chronic renal insufficiency stage III. History of
hypertension

EXAM:
RENAL / URINARY TRACT ULTRASOUND COMPLETE

[Series 1: us renal · 0.25mm/px · 14 of 47 slices shown]
[im 1/47]
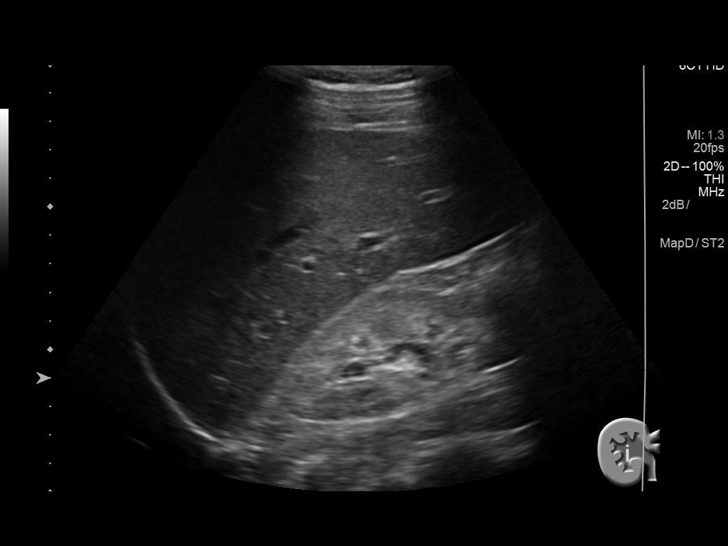
[im 4/47]
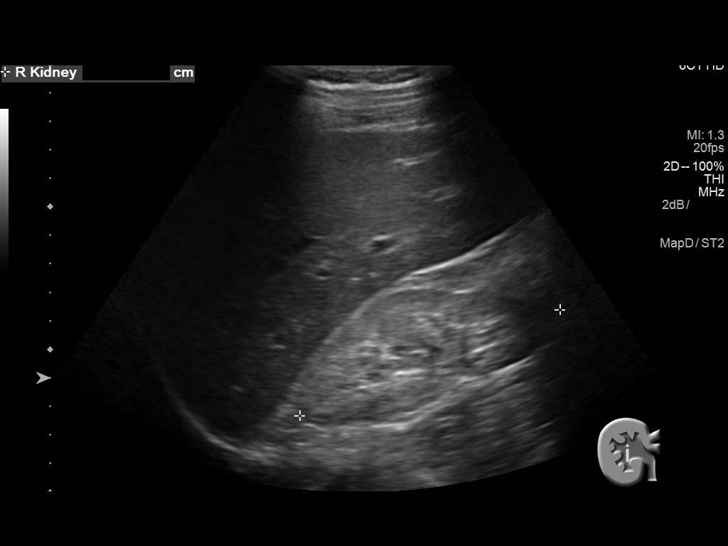
[im 8/47]
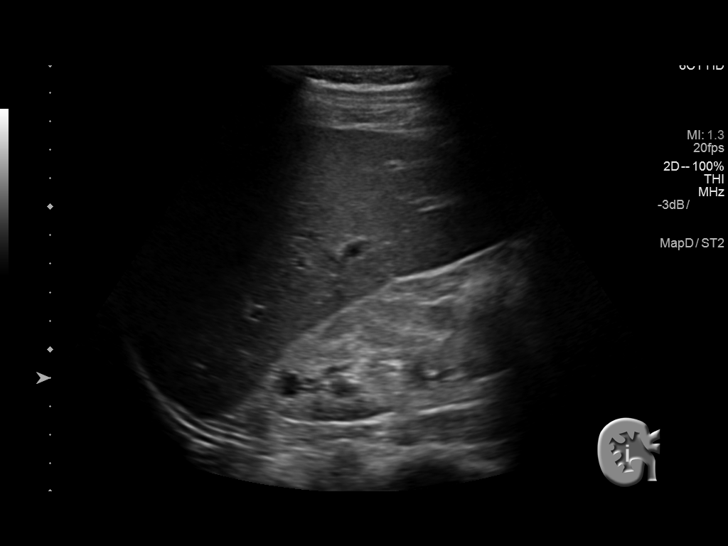
[im 12/47]
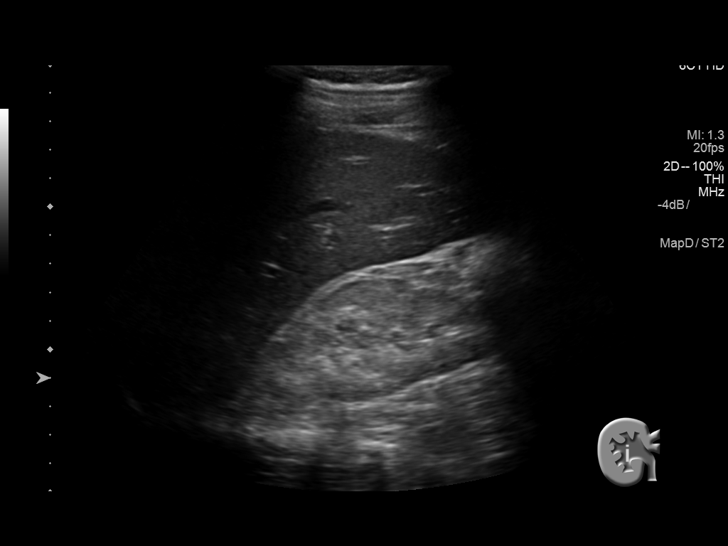
[im 16/47]
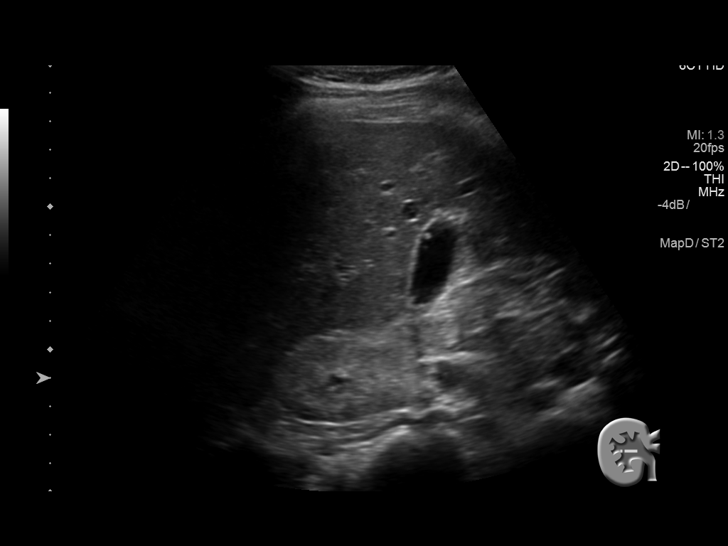
[im 18/47]
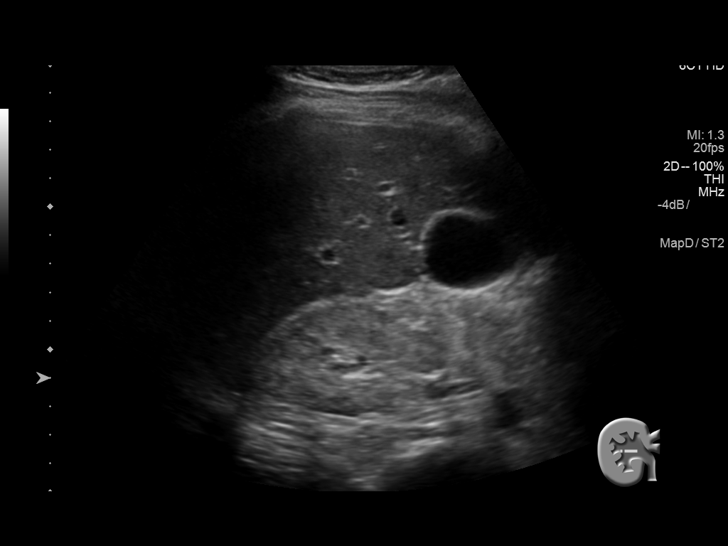
[im 22/47]
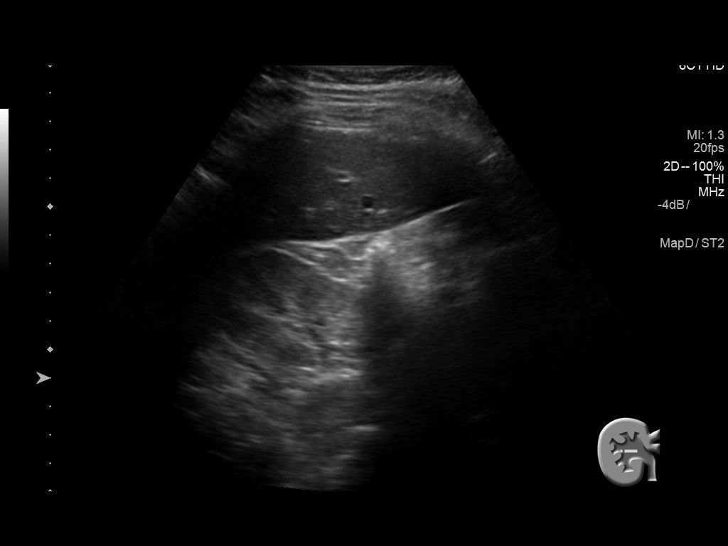
[im 25/47]
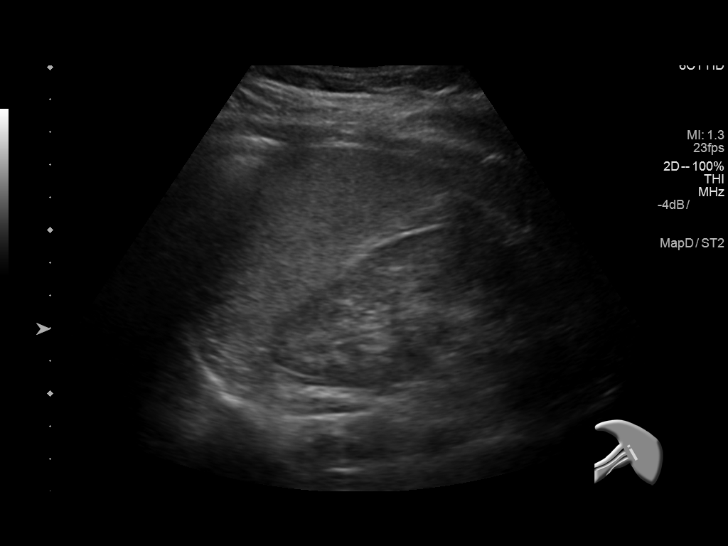
[im 29/47]
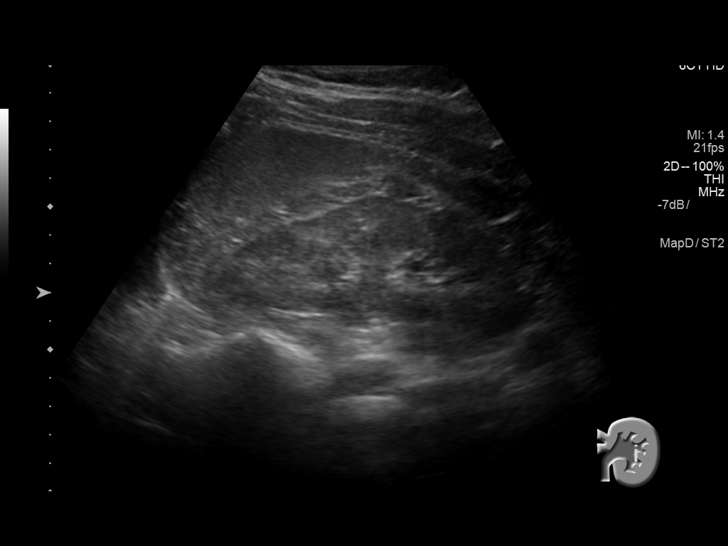
[im 31/47]
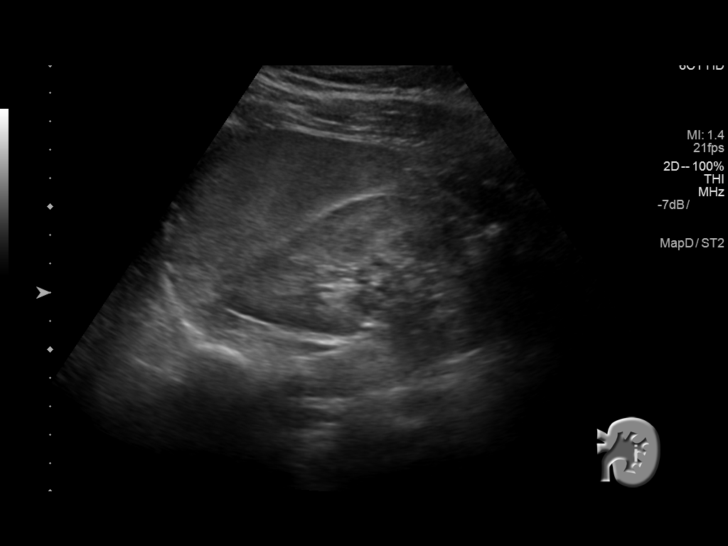
[im 35/47]
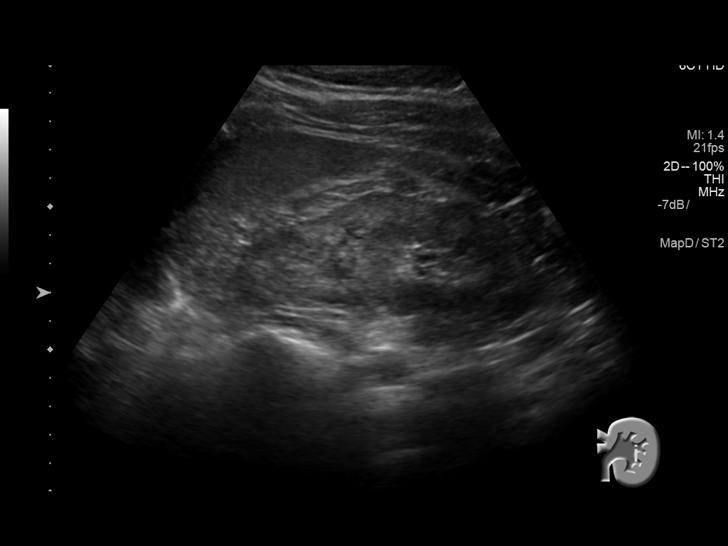
[im 39/47]
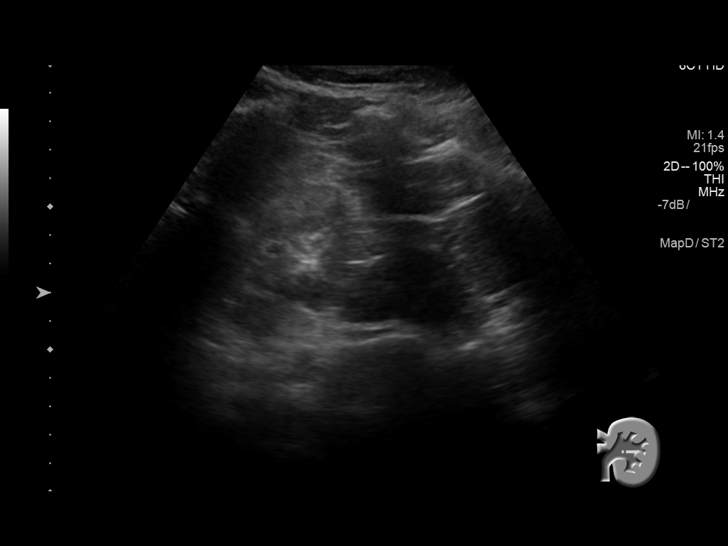
[im 43/47]
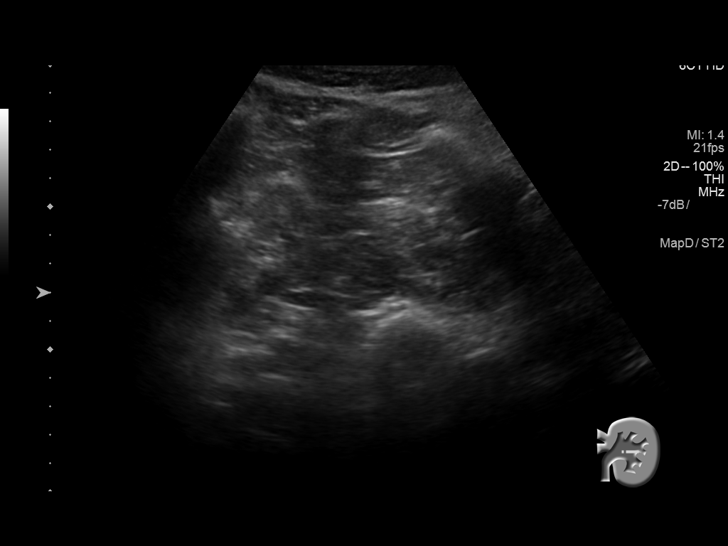
[im 47/47]
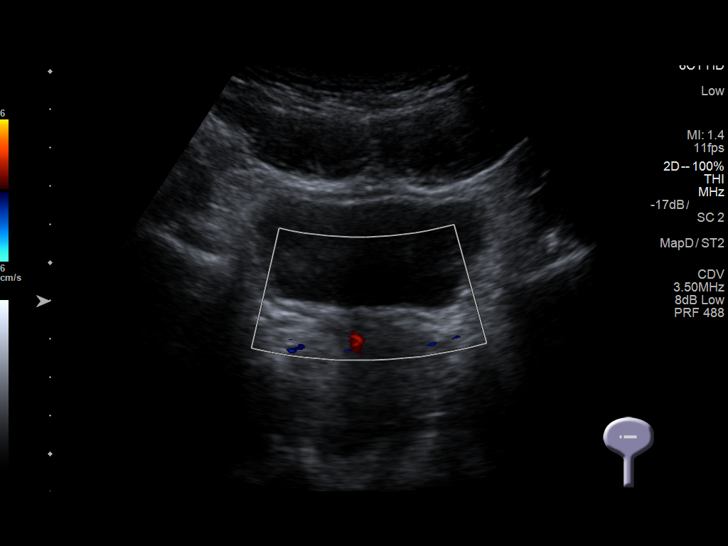

[14 of 25 positions shown; findings below may reference images not displayed]

FINDINGS: Right Kidney:

Length: 9.8 cm. The renal cortical echotexture is markedly
increased. There is mild cortical thinning. There is no
hydronephrosis. There is a simple appearing upper pole cyst
measuring 1 cm in diameter..

Left Kidney:

Length: 11.6 cm. The renal cortical echotexture is markedly
increased similar to that on the right. There is no focal mass nor
hydronephrosis.

Bladder:

Appears normal for degree of bladder distention. No ureteral jets
were observed.
IMPRESSION: Increased renal cortical echotexture bilaterally similar to that
seen on the previous study consistent with medical renal disease. No
significant decrease in renal size is observed. No hydronephrosis.
Simple appearing upper pole cyst on the right.

The partially distended urinary bladder is unremarkable.

## 2019-04-11 DIAGNOSIS — R809 Proteinuria, unspecified: Secondary | ICD-10-CM | POA: Diagnosis not present

## 2019-04-11 DIAGNOSIS — N1831 Chronic kidney disease, stage 3a: Secondary | ICD-10-CM | POA: Diagnosis not present

## 2019-04-11 DIAGNOSIS — E559 Vitamin D deficiency, unspecified: Secondary | ICD-10-CM | POA: Diagnosis not present

## 2019-04-11 DIAGNOSIS — I10 Essential (primary) hypertension: Secondary | ICD-10-CM | POA: Diagnosis not present

## 2019-04-11 DIAGNOSIS — D649 Anemia, unspecified: Secondary | ICD-10-CM | POA: Diagnosis not present

## 2019-04-12 DIAGNOSIS — D751 Secondary polycythemia: Secondary | ICD-10-CM | POA: Diagnosis not present

## 2019-04-12 DIAGNOSIS — N1831 Chronic kidney disease, stage 3a: Secondary | ICD-10-CM | POA: Diagnosis not present

## 2019-04-12 DIAGNOSIS — R809 Proteinuria, unspecified: Secondary | ICD-10-CM | POA: Diagnosis not present

## 2019-04-12 DIAGNOSIS — N041 Nephrotic syndrome with focal and segmental glomerular lesions: Secondary | ICD-10-CM | POA: Diagnosis not present

## 2019-06-26 IMAGING — US US BIOPSY
1 series · 9 of 9 positions shown · non-contrast
Comparison: none

CLINICAL DATA: Worsening chronic kidney disease, stage III and
proteinuria. Need for renal biopsy.

[Series 1: us biopsy · 0.22mm/px · 9 of 9 slices shown]
[im 1/9]
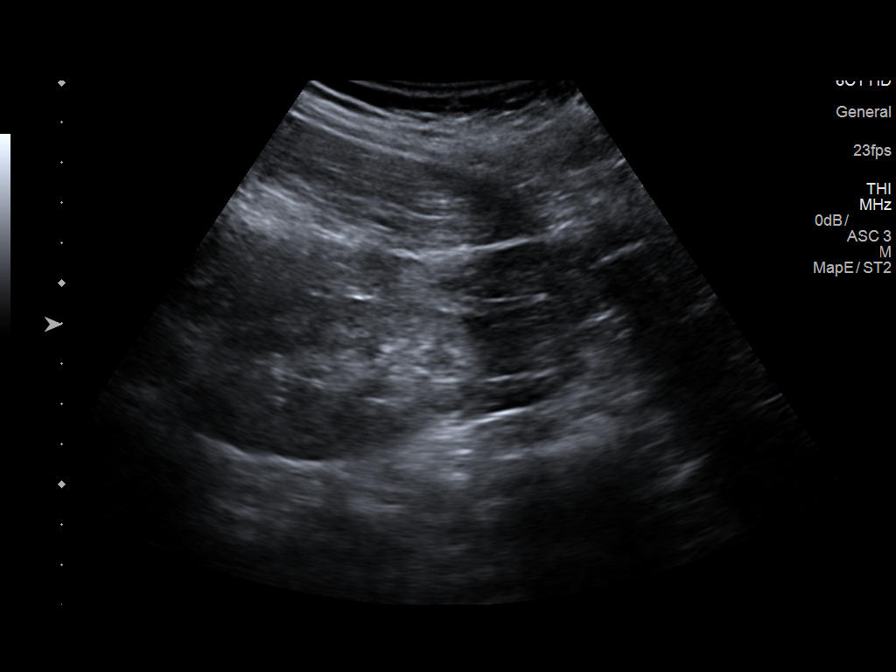
[im 2/9]
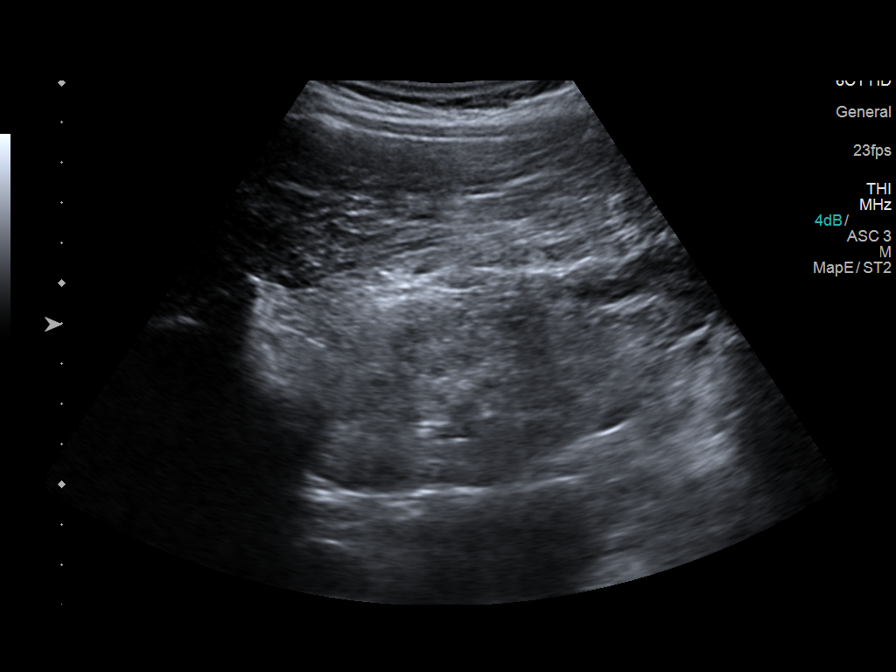
[im 3/9]
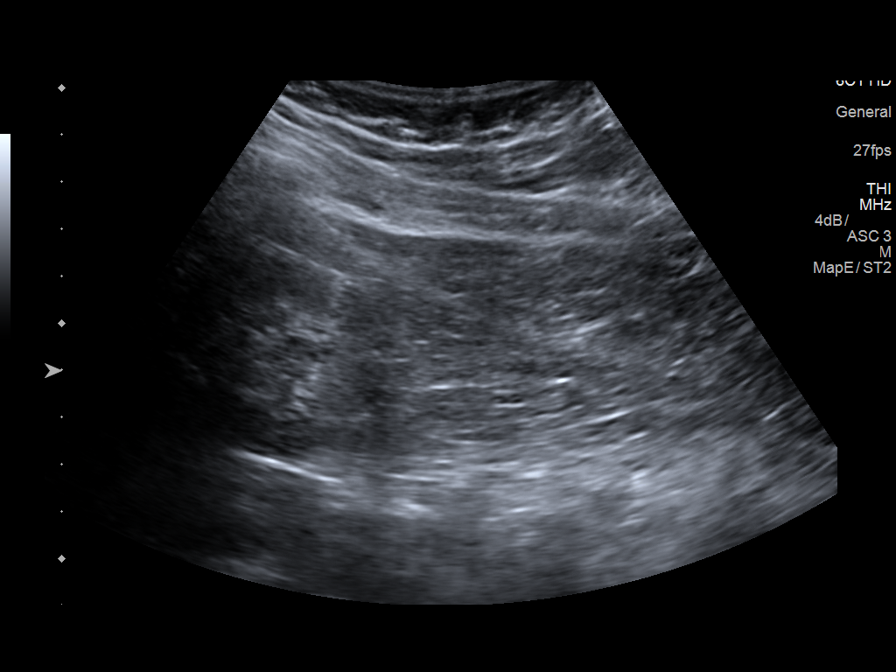
[im 4/9]
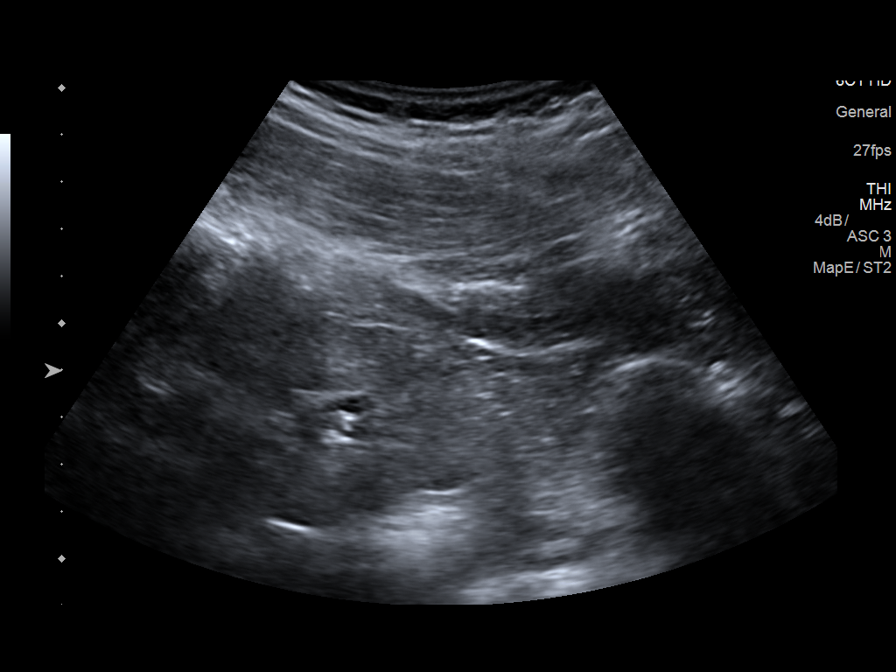
[im 5/9]
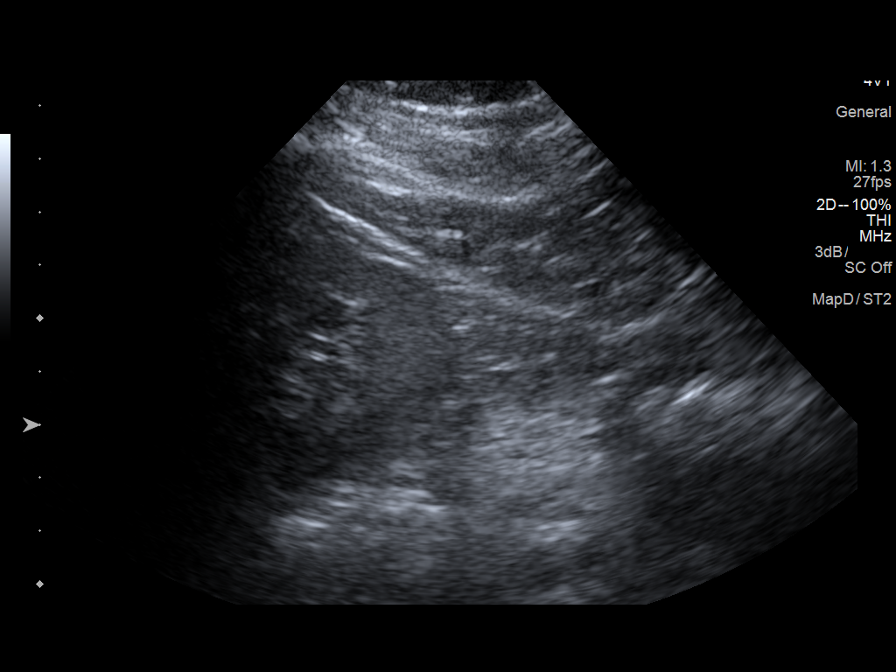
[im 6/9]
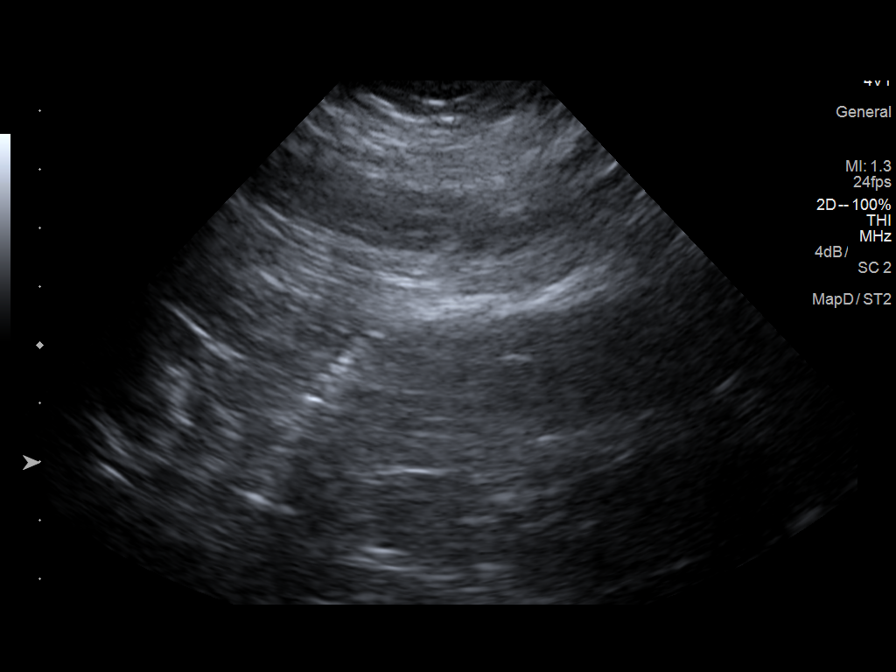
[im 7/9]
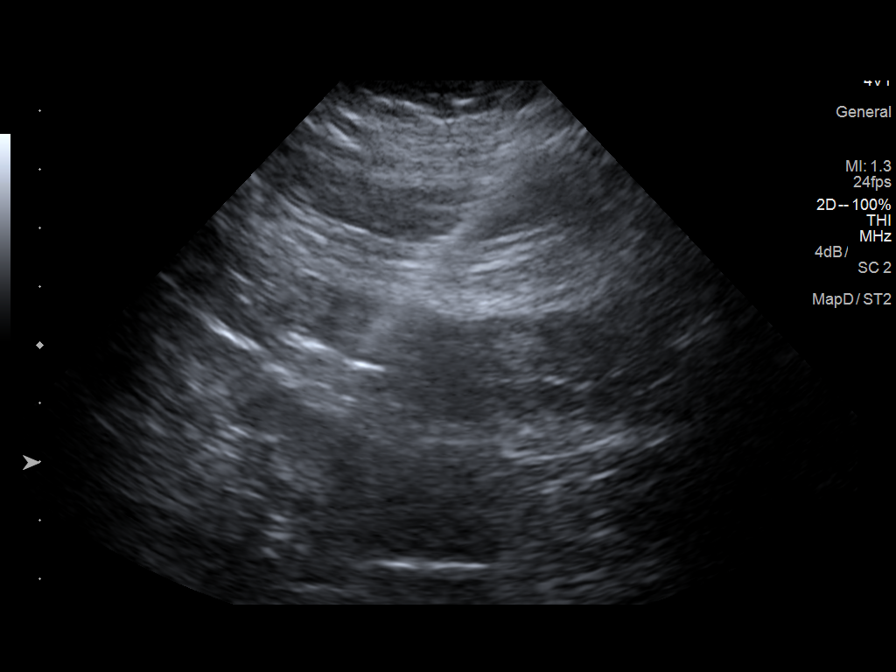
[im 8/9]
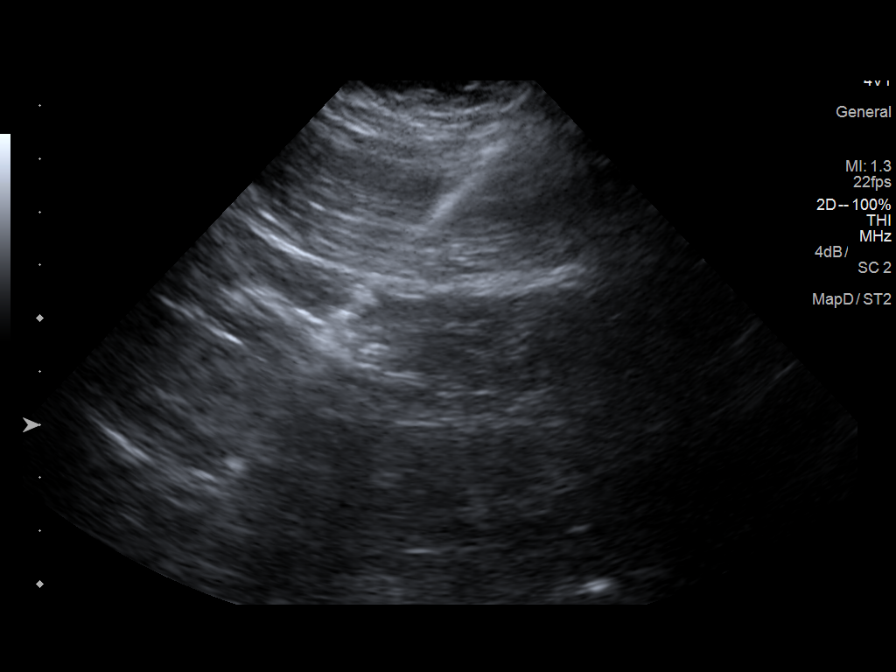
[im 9/9]
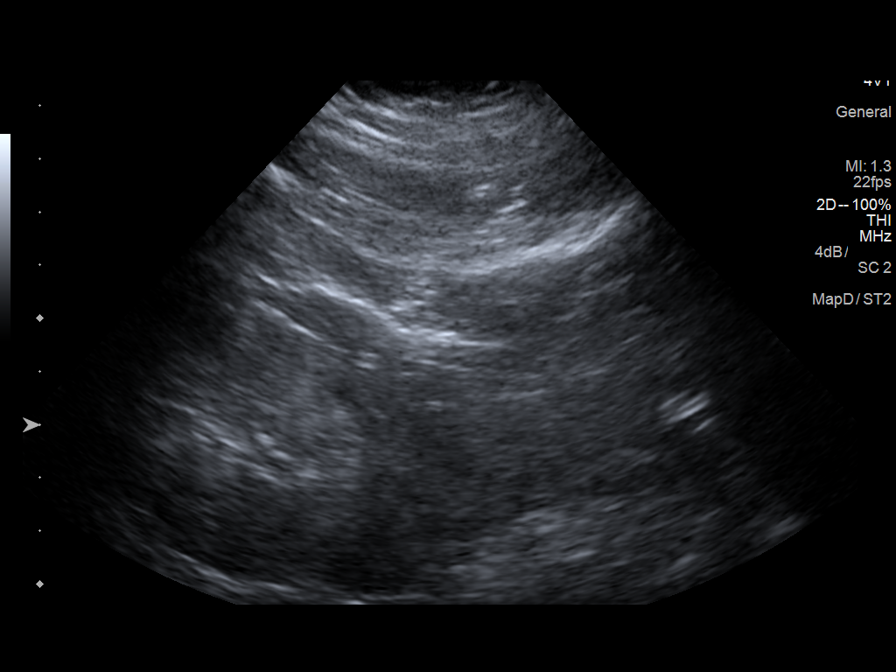

[9 of 9 positions shown; findings below may reference images not displayed]

EXAM:
ULTRASOUND GUIDED CORE BIOPSY OF LEFT KIDNEY

MEDICATIONS:
1.5 mg IV Versed; 100 mcg IV Fentanyl

Total Moderate Sedation Time: 19 minutes.

The patient's level of consciousness and physiologic status were
continuously monitored during the procedure by Radiology nursing.

PROCEDURE:
The procedure, risks, benefits, and alternatives were explained to
the patient. Questions regarding the procedure were encouraged and
answered. The patient understands and consents to the procedure. A
time out was performed prior to initiating the procedure.

Both kidneys were initially imaged by ultrasound from a posterior
approach. The left flank region was prepped with chlorhexidine in a
sterile fashion, and a sterile drape was applied covering the
operative field. A sterile gown and sterile gloves were used for the
procedure. Local anesthesia was provided with 1% Lidocaine.

Under ultrasound guidance, core biopsy samples were obtained of left
lower pole renal cortex utilizing a 16 gauge core biopsy device.
Three total passes were made with the core biopsy device. Samples
were submitted in saline.

COMPLICATIONS:
None.
FINDINGS: Both kidneys demonstrate significantly increased echogenicity
bilaterally. The left kidney is better visualized than the right and
was targeted for biopsy. The first core biopsy sample was fragmented
and fairly small. Two additional passes were therefore made yielding
completely intact core biopsy samples. Immediate post biopsy imaging
with ultrasound shows no obvious perinephric hemorrhage.
IMPRESSION: Ultrasound-guided core biopsy performed of the left kidney at the
level of lower pole cortex.

## 2019-07-08 DIAGNOSIS — R609 Edema, unspecified: Secondary | ICD-10-CM | POA: Diagnosis not present

## 2019-07-08 DIAGNOSIS — S92352A Displaced fracture of fifth metatarsal bone, left foot, initial encounter for closed fracture: Secondary | ICD-10-CM | POA: Diagnosis not present

## 2019-07-10 DIAGNOSIS — E039 Hypothyroidism, unspecified: Secondary | ICD-10-CM | POA: Diagnosis not present

## 2019-07-10 DIAGNOSIS — Y999 Unspecified external cause status: Secondary | ICD-10-CM | POA: Diagnosis not present

## 2019-07-10 DIAGNOSIS — X58XXXA Exposure to other specified factors, initial encounter: Secondary | ICD-10-CM | POA: Diagnosis not present

## 2019-07-10 DIAGNOSIS — S92355A Nondisplaced fracture of fifth metatarsal bone, left foot, initial encounter for closed fracture: Secondary | ICD-10-CM | POA: Diagnosis not present

## 2019-07-10 DIAGNOSIS — S92351A Displaced fracture of fifth metatarsal bone, right foot, initial encounter for closed fracture: Secondary | ICD-10-CM | POA: Diagnosis not present

## 2019-07-10 DIAGNOSIS — Y929 Unspecified place or not applicable: Secondary | ICD-10-CM | POA: Diagnosis not present

## 2019-07-10 DIAGNOSIS — Y939 Activity, unspecified: Secondary | ICD-10-CM | POA: Diagnosis not present

## 2019-07-10 DIAGNOSIS — S92352A Displaced fracture of fifth metatarsal bone, left foot, initial encounter for closed fracture: Secondary | ICD-10-CM | POA: Diagnosis not present

## 2019-08-07 DIAGNOSIS — S92352D Displaced fracture of fifth metatarsal bone, left foot, subsequent encounter for fracture with routine healing: Secondary | ICD-10-CM | POA: Diagnosis not present

## 2019-08-28 DIAGNOSIS — S92352D Displaced fracture of fifth metatarsal bone, left foot, subsequent encounter for fracture with routine healing: Secondary | ICD-10-CM | POA: Diagnosis not present

## 2019-09-09 DIAGNOSIS — S92352D Displaced fracture of fifth metatarsal bone, left foot, subsequent encounter for fracture with routine healing: Secondary | ICD-10-CM | POA: Diagnosis not present

## 2019-09-12 DIAGNOSIS — E781 Pure hyperglyceridemia: Secondary | ICD-10-CM | POA: Diagnosis not present

## 2019-09-12 DIAGNOSIS — I129 Hypertensive chronic kidney disease with stage 1 through stage 4 chronic kidney disease, or unspecified chronic kidney disease: Secondary | ICD-10-CM | POA: Diagnosis not present

## 2019-09-12 DIAGNOSIS — I10 Essential (primary) hypertension: Secondary | ICD-10-CM | POA: Diagnosis not present

## 2019-09-12 DIAGNOSIS — E785 Hyperlipidemia, unspecified: Secondary | ICD-10-CM | POA: Diagnosis not present

## 2019-09-13 DIAGNOSIS — N1831 Chronic kidney disease, stage 3a: Secondary | ICD-10-CM | POA: Diagnosis not present

## 2019-09-13 DIAGNOSIS — I10 Essential (primary) hypertension: Secondary | ICD-10-CM | POA: Diagnosis not present

## 2019-09-13 DIAGNOSIS — R945 Abnormal results of liver function studies: Secondary | ICD-10-CM | POA: Diagnosis not present

## 2019-09-13 DIAGNOSIS — I129 Hypertensive chronic kidney disease with stage 1 through stage 4 chronic kidney disease, or unspecified chronic kidney disease: Secondary | ICD-10-CM | POA: Diagnosis not present

## 2019-09-20 DIAGNOSIS — N1831 Chronic kidney disease, stage 3a: Secondary | ICD-10-CM | POA: Diagnosis not present

## 2019-09-20 DIAGNOSIS — I129 Hypertensive chronic kidney disease with stage 1 through stage 4 chronic kidney disease, or unspecified chronic kidney disease: Secondary | ICD-10-CM | POA: Diagnosis not present

## 2019-09-20 DIAGNOSIS — N041 Nephrotic syndrome with focal and segmental glomerular lesions: Secondary | ICD-10-CM | POA: Diagnosis not present

## 2019-09-20 DIAGNOSIS — E870 Hyperosmolality and hypernatremia: Secondary | ICD-10-CM | POA: Diagnosis not present

## 2019-09-27 DIAGNOSIS — S92352D Displaced fracture of fifth metatarsal bone, left foot, subsequent encounter for fracture with routine healing: Secondary | ICD-10-CM | POA: Diagnosis not present

## 2019-10-18 DIAGNOSIS — R945 Abnormal results of liver function studies: Secondary | ICD-10-CM | POA: Diagnosis not present

## 2019-10-18 DIAGNOSIS — I129 Hypertensive chronic kidney disease with stage 1 through stage 4 chronic kidney disease, or unspecified chronic kidney disease: Secondary | ICD-10-CM | POA: Diagnosis not present

## 2019-10-18 DIAGNOSIS — E782 Mixed hyperlipidemia: Secondary | ICD-10-CM | POA: Diagnosis not present

## 2019-10-18 DIAGNOSIS — E785 Hyperlipidemia, unspecified: Secondary | ICD-10-CM | POA: Diagnosis not present

## 2019-10-21 ENCOUNTER — Other Ambulatory Visit: Payer: Self-pay | Admitting: Internal Medicine

## 2019-10-21 DIAGNOSIS — R748 Abnormal levels of other serum enzymes: Secondary | ICD-10-CM

## 2019-11-01 ENCOUNTER — Other Ambulatory Visit: Payer: Self-pay

## 2019-11-01 ENCOUNTER — Ambulatory Visit (HOSPITAL_COMMUNITY)
Admission: RE | Admit: 2019-11-01 | Discharge: 2019-11-01 | Disposition: A | Discharge status: Home / Self Care | Payer: BC Managed Care – PPO | Source: Ambulatory Visit | Attending: Internal Medicine | Admitting: Internal Medicine

## 2019-11-01 DIAGNOSIS — R945 Abnormal results of liver function studies: Secondary | ICD-10-CM | POA: Diagnosis not present

## 2019-11-01 DIAGNOSIS — K8020 Calculus of gallbladder without cholecystitis without obstruction: Secondary | ICD-10-CM | POA: Diagnosis not present

## 2019-11-01 DIAGNOSIS — R748 Abnormal levels of other serum enzymes: Secondary | ICD-10-CM

## 2019-12-20 DIAGNOSIS — E870 Hyperosmolality and hypernatremia: Secondary | ICD-10-CM | POA: Diagnosis not present

## 2019-12-20 DIAGNOSIS — N041 Nephrotic syndrome with focal and segmental glomerular lesions: Secondary | ICD-10-CM | POA: Diagnosis not present

## 2019-12-20 DIAGNOSIS — I129 Hypertensive chronic kidney disease with stage 1 through stage 4 chronic kidney disease, or unspecified chronic kidney disease: Secondary | ICD-10-CM | POA: Diagnosis not present

## 2019-12-20 DIAGNOSIS — N1831 Chronic kidney disease, stage 3a: Secondary | ICD-10-CM | POA: Diagnosis not present

## 2020-01-17 DIAGNOSIS — E559 Vitamin D deficiency, unspecified: Secondary | ICD-10-CM | POA: Diagnosis not present

## 2020-01-17 DIAGNOSIS — N1831 Chronic kidney disease, stage 3a: Secondary | ICD-10-CM | POA: Diagnosis not present

## 2020-01-17 DIAGNOSIS — I129 Hypertensive chronic kidney disease with stage 1 through stage 4 chronic kidney disease, or unspecified chronic kidney disease: Secondary | ICD-10-CM | POA: Diagnosis not present

## 2020-01-17 DIAGNOSIS — N041 Nephrotic syndrome with focal and segmental glomerular lesions: Secondary | ICD-10-CM | POA: Diagnosis not present

## 2020-03-05 DIAGNOSIS — E785 Hyperlipidemia, unspecified: Secondary | ICD-10-CM | POA: Diagnosis not present

## 2020-03-05 DIAGNOSIS — E782 Mixed hyperlipidemia: Secondary | ICD-10-CM | POA: Diagnosis not present

## 2020-03-05 DIAGNOSIS — I129 Hypertensive chronic kidney disease with stage 1 through stage 4 chronic kidney disease, or unspecified chronic kidney disease: Secondary | ICD-10-CM | POA: Diagnosis not present

## 2020-03-05 DIAGNOSIS — N189 Chronic kidney disease, unspecified: Secondary | ICD-10-CM | POA: Diagnosis not present

## 2020-03-05 DIAGNOSIS — R945 Abnormal results of liver function studies: Secondary | ICD-10-CM | POA: Diagnosis not present

## 2020-03-06 DIAGNOSIS — R945 Abnormal results of liver function studies: Secondary | ICD-10-CM | POA: Diagnosis not present

## 2020-03-06 DIAGNOSIS — N1831 Chronic kidney disease, stage 3a: Secondary | ICD-10-CM | POA: Diagnosis not present

## 2020-03-06 DIAGNOSIS — E782 Mixed hyperlipidemia: Secondary | ICD-10-CM | POA: Diagnosis not present

## 2020-03-06 DIAGNOSIS — I129 Hypertensive chronic kidney disease with stage 1 through stage 4 chronic kidney disease, or unspecified chronic kidney disease: Secondary | ICD-10-CM | POA: Diagnosis not present

## 2020-04-30 DIAGNOSIS — I129 Hypertensive chronic kidney disease with stage 1 through stage 4 chronic kidney disease, or unspecified chronic kidney disease: Secondary | ICD-10-CM | POA: Diagnosis not present

## 2020-04-30 DIAGNOSIS — E559 Vitamin D deficiency, unspecified: Secondary | ICD-10-CM | POA: Diagnosis not present

## 2020-04-30 DIAGNOSIS — N1831 Chronic kidney disease, stage 3a: Secondary | ICD-10-CM | POA: Diagnosis not present

## 2020-04-30 DIAGNOSIS — N041 Nephrotic syndrome with focal and segmental glomerular lesions: Secondary | ICD-10-CM | POA: Diagnosis not present

## 2020-05-01 DIAGNOSIS — N041 Nephrotic syndrome with focal and segmental glomerular lesions: Secondary | ICD-10-CM | POA: Diagnosis not present

## 2020-05-01 DIAGNOSIS — R809 Proteinuria, unspecified: Secondary | ICD-10-CM | POA: Diagnosis not present

## 2020-05-01 DIAGNOSIS — N1831 Chronic kidney disease, stage 3a: Secondary | ICD-10-CM | POA: Diagnosis not present

## 2020-05-01 DIAGNOSIS — I129 Hypertensive chronic kidney disease with stage 1 through stage 4 chronic kidney disease, or unspecified chronic kidney disease: Secondary | ICD-10-CM | POA: Diagnosis not present

## 2020-07-02 DIAGNOSIS — E782 Mixed hyperlipidemia: Secondary | ICD-10-CM | POA: Diagnosis not present

## 2020-07-02 DIAGNOSIS — I129 Hypertensive chronic kidney disease with stage 1 through stage 4 chronic kidney disease, or unspecified chronic kidney disease: Secondary | ICD-10-CM | POA: Diagnosis not present

## 2020-07-02 DIAGNOSIS — E785 Hyperlipidemia, unspecified: Secondary | ICD-10-CM | POA: Diagnosis not present

## 2020-07-02 DIAGNOSIS — N1831 Chronic kidney disease, stage 3a: Secondary | ICD-10-CM | POA: Diagnosis not present

## 2020-07-02 DIAGNOSIS — N189 Chronic kidney disease, unspecified: Secondary | ICD-10-CM | POA: Diagnosis not present

## 2020-07-03 DIAGNOSIS — N1832 Chronic kidney disease, stage 3b: Secondary | ICD-10-CM | POA: Diagnosis not present

## 2020-07-03 DIAGNOSIS — N041 Nephrotic syndrome with focal and segmental glomerular lesions: Secondary | ICD-10-CM | POA: Diagnosis not present

## 2020-07-03 DIAGNOSIS — I129 Hypertensive chronic kidney disease with stage 1 through stage 4 chronic kidney disease, or unspecified chronic kidney disease: Secondary | ICD-10-CM | POA: Diagnosis not present

## 2020-07-03 DIAGNOSIS — R809 Proteinuria, unspecified: Secondary | ICD-10-CM | POA: Diagnosis not present

## 2020-09-29 DIAGNOSIS — R809 Proteinuria, unspecified: Secondary | ICD-10-CM | POA: Diagnosis not present

## 2020-09-29 DIAGNOSIS — N1831 Chronic kidney disease, stage 3a: Secondary | ICD-10-CM | POA: Diagnosis not present

## 2020-09-29 DIAGNOSIS — N041 Nephrotic syndrome with focal and segmental glomerular lesions: Secondary | ICD-10-CM | POA: Diagnosis not present

## 2020-09-29 DIAGNOSIS — I129 Hypertensive chronic kidney disease with stage 1 through stage 4 chronic kidney disease, or unspecified chronic kidney disease: Secondary | ICD-10-CM | POA: Diagnosis not present

## 2020-10-09 DIAGNOSIS — N1832 Chronic kidney disease, stage 3b: Secondary | ICD-10-CM | POA: Diagnosis not present

## 2020-10-09 DIAGNOSIS — N041 Nephrotic syndrome with focal and segmental glomerular lesions: Secondary | ICD-10-CM | POA: Diagnosis not present

## 2020-10-09 DIAGNOSIS — I129 Hypertensive chronic kidney disease with stage 1 through stage 4 chronic kidney disease, or unspecified chronic kidney disease: Secondary | ICD-10-CM | POA: Diagnosis not present

## 2020-10-09 DIAGNOSIS — R809 Proteinuria, unspecified: Secondary | ICD-10-CM | POA: Diagnosis not present

## 2021-01-13 DIAGNOSIS — I129 Hypertensive chronic kidney disease with stage 1 through stage 4 chronic kidney disease, or unspecified chronic kidney disease: Secondary | ICD-10-CM | POA: Diagnosis not present

## 2021-01-13 DIAGNOSIS — N1832 Chronic kidney disease, stage 3b: Secondary | ICD-10-CM | POA: Diagnosis not present

## 2021-01-13 DIAGNOSIS — N041 Nephrotic syndrome with focal and segmental glomerular lesions: Secondary | ICD-10-CM | POA: Diagnosis not present

## 2021-01-13 DIAGNOSIS — R809 Proteinuria, unspecified: Secondary | ICD-10-CM | POA: Diagnosis not present

## 2021-01-15 DIAGNOSIS — N1832 Chronic kidney disease, stage 3b: Secondary | ICD-10-CM | POA: Diagnosis not present

## 2021-01-15 DIAGNOSIS — I129 Hypertensive chronic kidney disease with stage 1 through stage 4 chronic kidney disease, or unspecified chronic kidney disease: Secondary | ICD-10-CM | POA: Diagnosis not present

## 2021-01-15 DIAGNOSIS — R809 Proteinuria, unspecified: Secondary | ICD-10-CM | POA: Diagnosis not present

## 2021-01-15 DIAGNOSIS — N041 Nephrotic syndrome with focal and segmental glomerular lesions: Secondary | ICD-10-CM | POA: Diagnosis not present

## 2021-01-29 DIAGNOSIS — R809 Proteinuria, unspecified: Secondary | ICD-10-CM | POA: Diagnosis not present

## 2021-01-29 DIAGNOSIS — I129 Hypertensive chronic kidney disease with stage 1 through stage 4 chronic kidney disease, or unspecified chronic kidney disease: Secondary | ICD-10-CM | POA: Diagnosis not present

## 2021-01-29 DIAGNOSIS — N041 Nephrotic syndrome with focal and segmental glomerular lesions: Secondary | ICD-10-CM | POA: Diagnosis not present

## 2021-01-29 DIAGNOSIS — N1832 Chronic kidney disease, stage 3b: Secondary | ICD-10-CM | POA: Diagnosis not present

## 2021-02-21 IMAGING — US US ABDOMEN LIMITED
1 series · 14 of 25 positions shown · non-contrast
Comparison: CT abdomen and pelvis January 26, 2015

CLINICAL DATA: Elevated liver enzymes

EXAM:
ULTRASOUND ABDOMEN LIMITED RIGHT UPPER QUADRANT

[Series 1: us abdomen limited · 0.22mm/px · 14 of 51 slices shown]
[im 1/51]
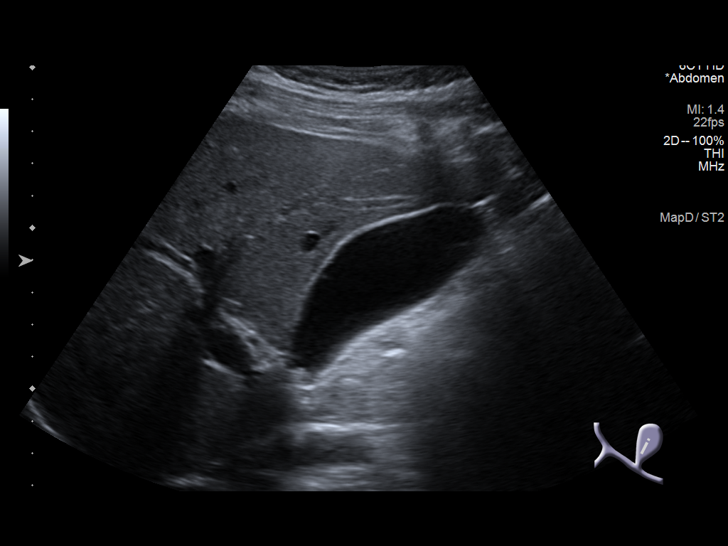
[im 5/51]
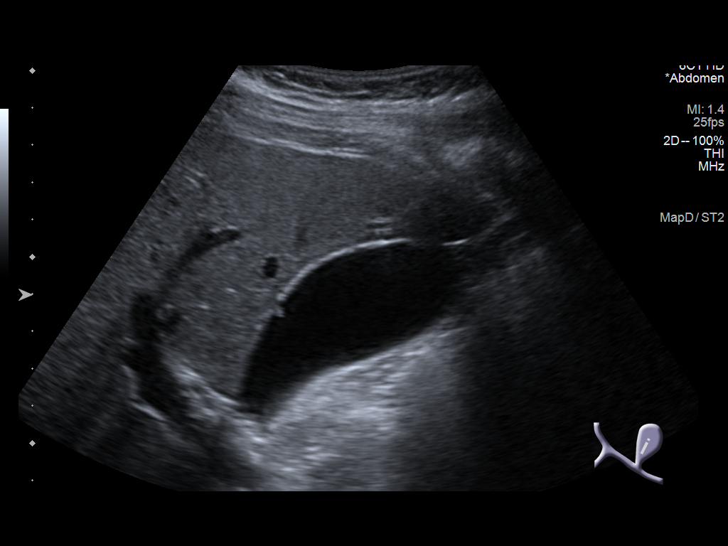
[im 9/51]
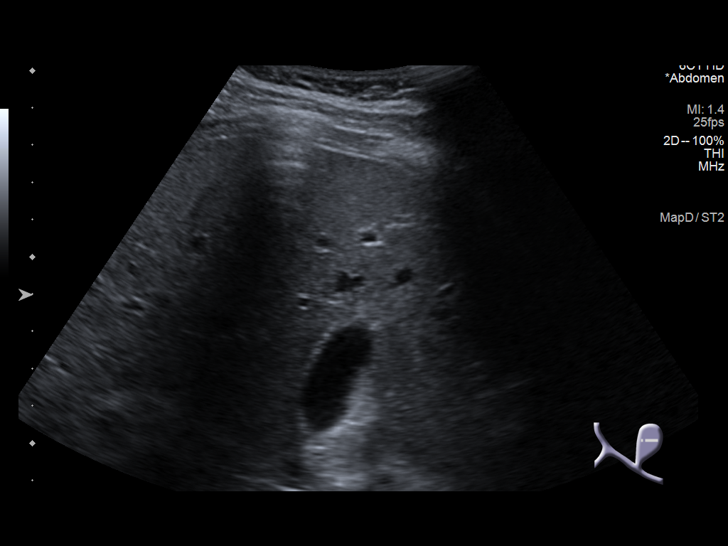
[im 13/51]
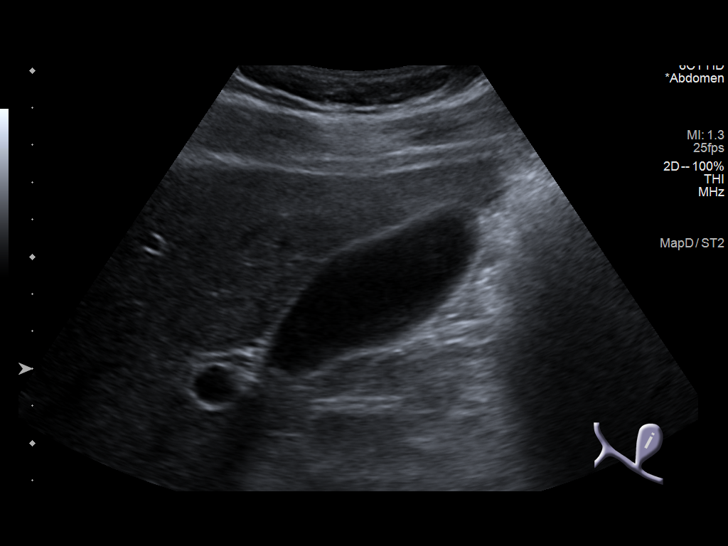
[im 17/51]
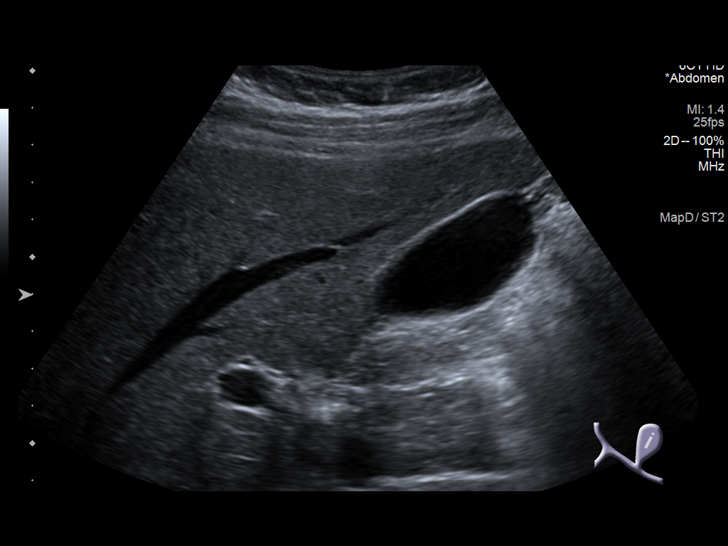
[im 19/51]
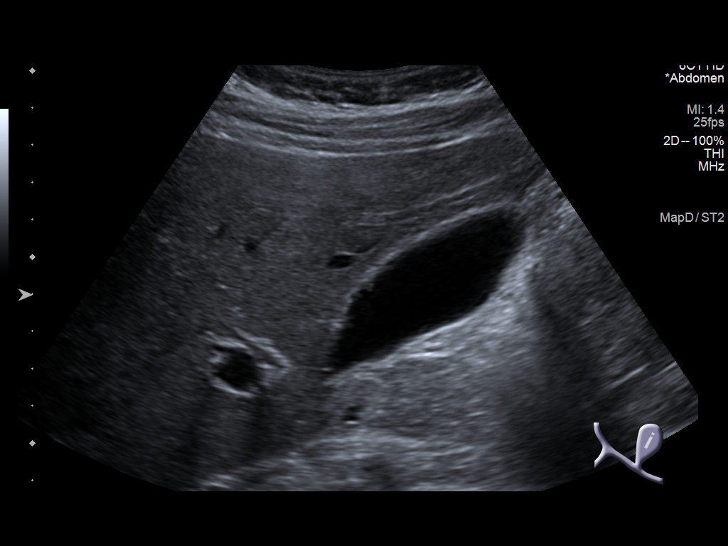
[im 23/51]
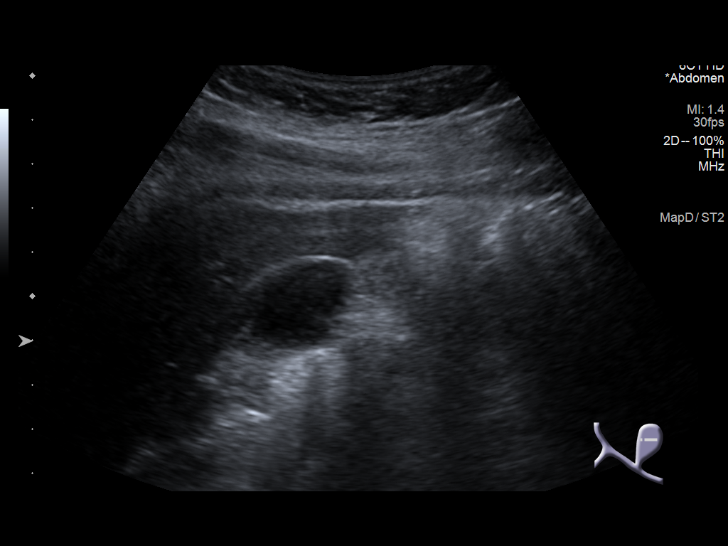
[im 28/51]
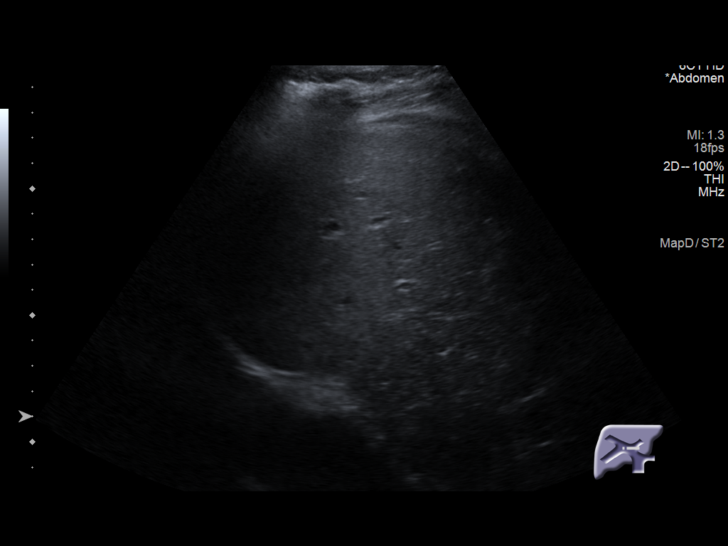
[im 32/51]
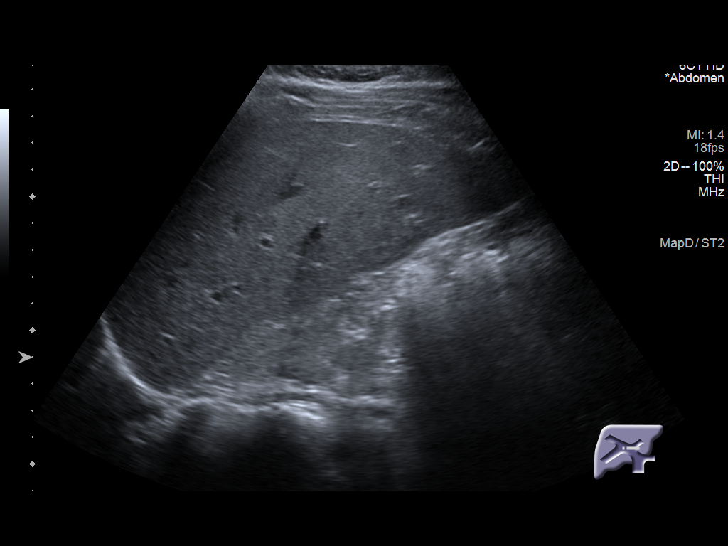
[im 34/51]
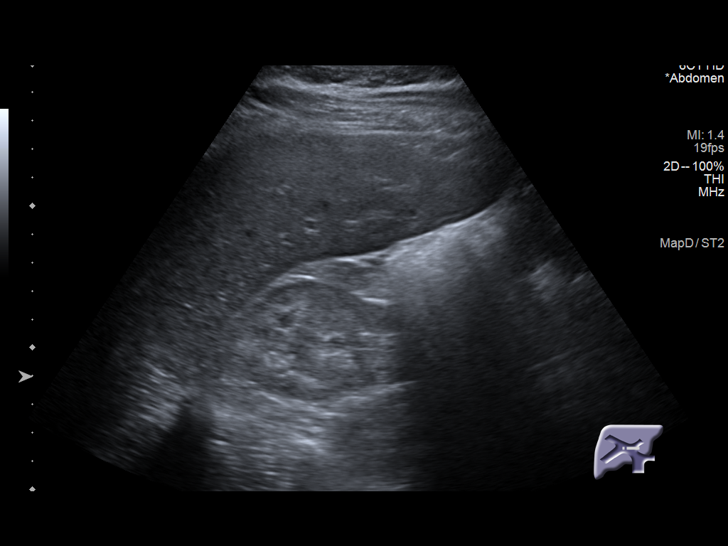
[im 38/51]
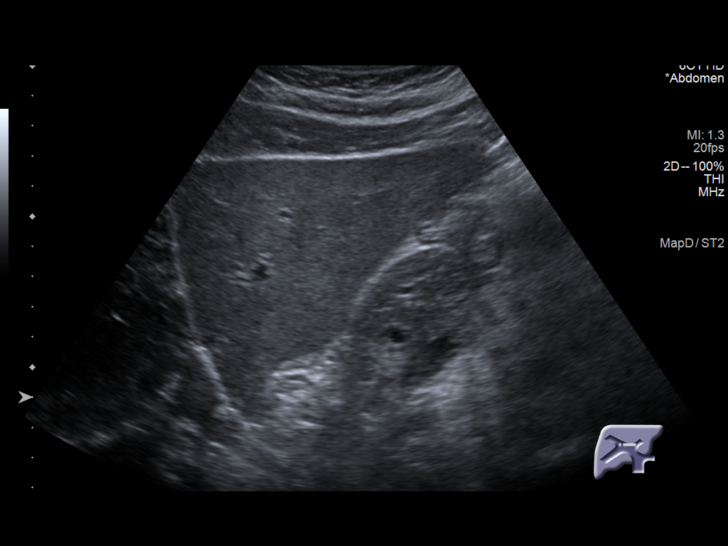
[im 42/51]
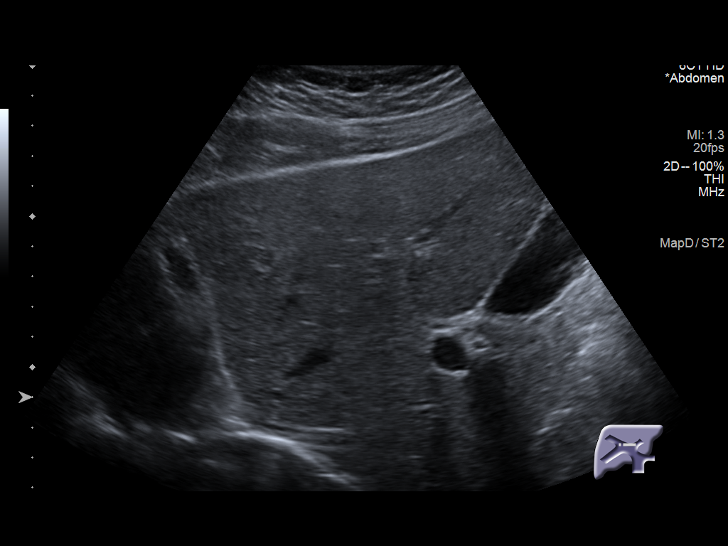
[im 46/51]
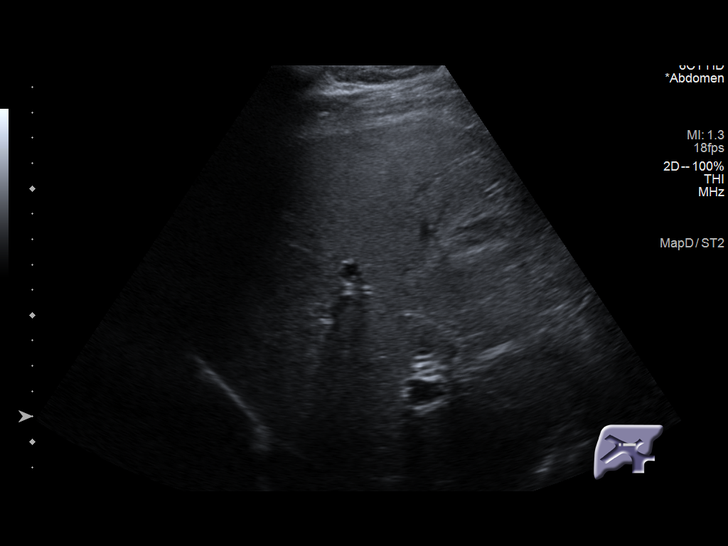
[im 51/51]
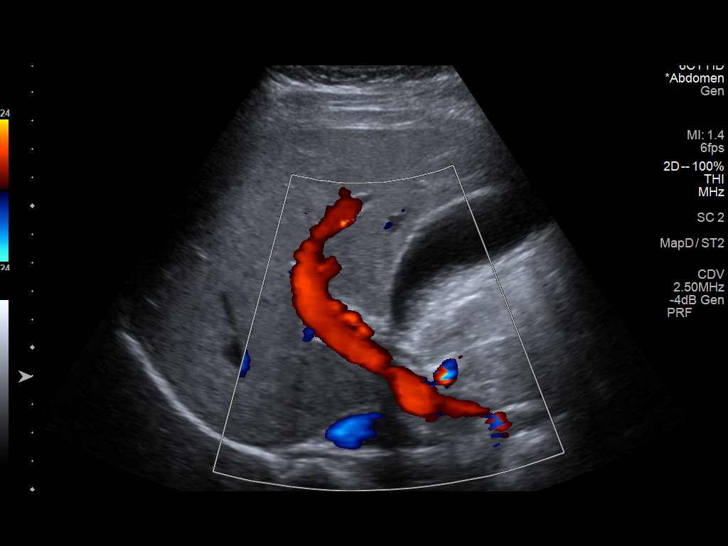

[14 of 25 positions shown; findings below may reference images not displayed]

FINDINGS: Gallbladder:

There is a 3 mm echogenic focus along the wall of the gallbladder
which neither moves nor shadows, an apparent small polyp. There are
no echogenic foci in the gallbladder which move and shadow as is
expected with cholelithiasis. No gallbladder wall thickening or
pericholecystic fluid. No sonographic Murphy sign noted by
sonographer.

Common bile duct:

Diameter: 3 mm. No intrahepatic or extrahepatic biliary duct
dilatation.

Liver:

No focal lesion identified. Within normal limits in parenchymal
echogenicity. Portal vein is patent on color Doppler imaging with
normal direction of blood flow towards the liver.

Other: None.
IMPRESSION: 3 mm apparent gallbladder polyp. Per consensus guidelines, a polyp
of this small size does not warrant additional imaging surveillance.
No gallstones, gallbladder wall thickening, or pericholecystic
fluid.

Study otherwise unremarkable.

## 2021-04-12 DIAGNOSIS — N041 Nephrotic syndrome with focal and segmental glomerular lesions: Secondary | ICD-10-CM | POA: Diagnosis not present

## 2021-04-12 DIAGNOSIS — N1832 Chronic kidney disease, stage 3b: Secondary | ICD-10-CM | POA: Diagnosis not present

## 2021-04-12 DIAGNOSIS — I129 Hypertensive chronic kidney disease with stage 1 through stage 4 chronic kidney disease, or unspecified chronic kidney disease: Secondary | ICD-10-CM | POA: Diagnosis not present

## 2021-04-12 DIAGNOSIS — R809 Proteinuria, unspecified: Secondary | ICD-10-CM | POA: Diagnosis not present

## 2021-04-16 DIAGNOSIS — N041 Nephrotic syndrome with focal and segmental glomerular lesions: Secondary | ICD-10-CM | POA: Diagnosis not present

## 2021-04-16 DIAGNOSIS — R809 Proteinuria, unspecified: Secondary | ICD-10-CM | POA: Diagnosis not present

## 2021-04-16 DIAGNOSIS — N1832 Chronic kidney disease, stage 3b: Secondary | ICD-10-CM | POA: Diagnosis not present

## 2021-04-16 DIAGNOSIS — I129 Hypertensive chronic kidney disease with stage 1 through stage 4 chronic kidney disease, or unspecified chronic kidney disease: Secondary | ICD-10-CM | POA: Diagnosis not present

## 2021-06-28 DIAGNOSIS — S61210A Laceration without foreign body of right index finger without damage to nail, initial encounter: Secondary | ICD-10-CM | POA: Diagnosis not present

## 2021-07-16 DIAGNOSIS — N1832 Chronic kidney disease, stage 3b: Secondary | ICD-10-CM | POA: Diagnosis not present

## 2021-07-16 DIAGNOSIS — R809 Proteinuria, unspecified: Secondary | ICD-10-CM | POA: Diagnosis not present

## 2021-07-16 DIAGNOSIS — N041 Nephrotic syndrome with focal and segmental glomerular lesions: Secondary | ICD-10-CM | POA: Diagnosis not present

## 2021-07-16 DIAGNOSIS — I129 Hypertensive chronic kidney disease with stage 1 through stage 4 chronic kidney disease, or unspecified chronic kidney disease: Secondary | ICD-10-CM | POA: Diagnosis not present

## 2021-08-07 DIAGNOSIS — I129 Hypertensive chronic kidney disease with stage 1 through stage 4 chronic kidney disease, or unspecified chronic kidney disease: Secondary | ICD-10-CM | POA: Diagnosis not present

## 2021-08-07 DIAGNOSIS — N041 Nephrotic syndrome with focal and segmental glomerular lesions: Secondary | ICD-10-CM | POA: Diagnosis not present

## 2021-08-07 DIAGNOSIS — N1832 Chronic kidney disease, stage 3b: Secondary | ICD-10-CM | POA: Diagnosis not present

## 2021-08-07 DIAGNOSIS — R809 Proteinuria, unspecified: Secondary | ICD-10-CM | POA: Diagnosis not present

## 2021-09-03 ENCOUNTER — Inpatient Hospital Stay (HOSPITAL_COMMUNITY): Payer: BC Managed Care – PPO | Attending: Hematology | Admitting: Hematology

## 2021-09-03 ENCOUNTER — Encounter (HOSPITAL_COMMUNITY): Payer: Self-pay | Admitting: Hematology

## 2021-09-03 ENCOUNTER — Inpatient Hospital Stay (HOSPITAL_COMMUNITY): Payer: BC Managed Care – PPO

## 2021-09-03 DIAGNOSIS — N189 Chronic kidney disease, unspecified: Secondary | ICD-10-CM | POA: Insufficient documentation

## 2021-09-03 DIAGNOSIS — I129 Hypertensive chronic kidney disease with stage 1 through stage 4 chronic kidney disease, or unspecified chronic kidney disease: Secondary | ICD-10-CM | POA: Insufficient documentation

## 2021-09-03 DIAGNOSIS — D751 Secondary polycythemia: Secondary | ICD-10-CM | POA: Insufficient documentation

## 2021-09-03 DIAGNOSIS — Z800 Family history of malignant neoplasm of digestive organs: Secondary | ICD-10-CM | POA: Diagnosis not present

## 2021-09-03 LAB — CBC WITH DIFFERENTIAL/PLATELET
Abs Immature Granulocytes: 0.01 10*3/uL (ref 0.00–0.07)
Basophils Absolute: 0.1 10*3/uL (ref 0.0–0.1)
Basophils Relative: 1 %
Eosinophils Absolute: 0.1 10*3/uL (ref 0.0–0.5)
Eosinophils Relative: 2 %
HCT: 52.7 % — ABNORMAL HIGH (ref 39.0–52.0)
Hemoglobin: 17.5 g/dL — ABNORMAL HIGH (ref 13.0–17.0)
Immature Granulocytes: 0 %
Lymphocytes Relative: 24 %
Lymphs Abs: 1.3 10*3/uL (ref 0.7–4.0)
MCH: 26.1 pg (ref 26.0–34.0)
MCHC: 33.2 g/dL (ref 30.0–36.0)
MCV: 78.5 fL — ABNORMAL LOW (ref 80.0–100.0)
Monocytes Absolute: 0.3 10*3/uL (ref 0.1–1.0)
Monocytes Relative: 5 %
Neutro Abs: 3.6 10*3/uL (ref 1.7–7.7)
Neutrophils Relative %: 68 %
Platelets: 283 10*3/uL (ref 150–400)
RBC: 6.71 MIL/uL — ABNORMAL HIGH (ref 4.22–5.81)
RDW: 14.3 % (ref 11.5–15.5)
WBC: 5.4 10*3/uL (ref 4.0–10.5)
nRBC: 0 % (ref 0.0–0.2)

## 2021-09-03 NOTE — Progress Notes (Signed)
[facility] [address], [place] [address]   CLINIC:  Medical Oncology/Hematology  Patient Care Team: Benita Stabile, MD as PCP - General (Internal Medicine) Doreatha Massed, MD as Medical Oncologist (Hematology)  CHIEF COMPLAINTS/PURPOSE OF CONSULTATION:  Evaluation for erythrocytosis  HISTORY OF PRESENTING ILLNESS:  Ricardo [name] 31 y.o. male is here because of evaluation for erythrocytosis, at the request of Dr. Wolfgang Phoenix.  Today he reports feeling good. He reports previously having been told of a history of erythrocytosis once previously. He denies using testosterone supplements or steroid tablets. He denies ankle swellings, rash, joint pains, fatigue, fevers, night sweats, weight loss, vision changes, itching, changing colors of fingertips. He reports he snores when he sleeps. He denies needing to nap throughout the day. He denies hematuria.   He lives at home with his wife. He denies family history of sickle cell disease and thalassemia. He reports he current works at [employer]	[place]. He denies chemical exposure and smoking history. His maternal grandmother had cancer of an unknown type, and his maternal grandfather had colon cancer.   MEDICAL HISTORY:  Past Medical History:  Diagnosis Date   Hypertension     SURGICAL HISTORY: Past Surgical History:  Procedure Laterality Date   LAPAROSCOPIC APPENDECTOMY N/A 01/26/2015   Procedure: APPENDECTOMY LAPAROSCOPIC;  Surgeon: Franky Macho, MD;  Location: [facility];  Service: General;  Laterality: N/A;    SOCIAL HISTORY: Social History   Socioeconomic History   Marital status: Married    Spouse name: Not on file   Number of children: Not on file   Years of education: Not on file   Highest education level: Not on file  Occupational History   Not on file  Tobacco Use   Smoking status: Never   Smokeless tobacco: Not on file  Substance and Sexual Activity   Alcohol use: Yes    Alcohol/week: 2.0  standard drinks of alcohol    Types: 2 Cans of beer per week    Comment: occasional   Drug use: No   Sexual activity: Yes  Other Topics Concern   Not on file  Social History Narrative   Not on file   Social Determinants of Health   Financial Resource Strain: Not on file  Food Insecurity: Not on file  Transportation Needs: Not on file  Physical Activity: Not on file  Stress: Not on file  Social Connections: Not on file  Intimate Partner Violence: Not on file    FAMILY HISTORY: History reviewed. No pertinent family history.  ALLERGIES:  has No Known Allergies.  MEDICATIONS:  Current Outpatient Medications  Medication Sig Dispense Refill   atorvastatin (LIPITOR) 10 MG tablet Take 10 mg by mouth daily.     enalapril (VASOTEC) 20 MG tablet Take 40 mg by mouth daily.      ergocalciferol (VITAMIN D2) 50000 units capsule Take 50,000 Units by mouth every Tuesday.     No current facility-administered medications for this visit.    REVIEW OF SYSTEMS:   Review of Systems  Constitutional:  Negative for appetite change, fatigue, fever and unexpected weight change.  Eyes:  Negative for eye problems.  Cardiovascular:  Negative for leg swelling.  Endocrine: Negative for hot flashes.  Genitourinary:  Negative for hematuria.   Musculoskeletal:  Negative for arthralgias.  Skin:  Negative for itching and rash.  Neurological:  Negative for headaches.  All other systems reviewed and are negative.    PHYSICAL EXAMINATION: ECOG PERFORMANCE STATUS: 0 -  Asymptomatic  Vitals:   09/03/21 0902  BP: (!) 134/103  Pulse: 77  Resp: 18  Temp: 97.8 F (36.6 C)  SpO2: 99%   Filed Weights   09/03/21 0902  Weight: 205 lb 14.6 oz (93.4 kg)   Physical Exam Vitals reviewed.  Constitutional:      Appearance: Normal appearance.  Cardiovascular:     Rate and Rhythm: Normal rate and regular rhythm.     Pulses: Normal pulses.     Heart sounds: Normal heart sounds.  Pulmonary:     Effort:  Pulmonary effort is normal.     Breath sounds: Normal breath sounds.  Abdominal:     Palpations: Abdomen is soft. There is no hepatomegaly, splenomegaly or mass.     Tenderness: There is no abdominal tenderness.  Lymphadenopathy:     Cervical: No cervical adenopathy.     Right cervical: No superficial cervical adenopathy.    Left cervical: No superficial cervical adenopathy.     Upper Body:     Right upper body: No supraclavicular or axillary adenopathy.     Left upper body: No supraclavicular or axillary adenopathy.  Neurological:     General: No focal deficit present.     Mental Status: He is alert and oriented to person, place, and time.  Psychiatric:        Mood and Affect: Mood normal.        Behavior: Behavior normal.      LABORATORY DATA:  I have reviewed the data as listed Recent Results (from the past 2160 hour(s))  CBC with Differential     Status: Abnormal   Collection Time: 09/03/21  9:27 AM  Result Value Ref Range   WBC 5.4 4.0 - 10.5 K/uL   RBC 6.71 (H) 4.22 - 5.81 MIL/uL   Hemoglobin 17.5 (H) 13.0 - 17.0 g/dL   HCT 52.7 (H) 39.0 - 52.0 %   MCV 78.5 (L) 80.0 - 100.0 fL   MCH 26.1 26.0 - 34.0 pg   MCHC 33.2 30.0 - 36.0 g/dL   RDW 14.3 11.5 - 15.5 %   Platelets 283 150 - 400 K/uL   nRBC 0.0 0.0 - 0.2 %   Neutrophils Relative % 68 %   Neutro Abs 3.6 1.7 - 7.7 K/uL   Lymphocytes Relative 24 %   Lymphs Abs 1.3 0.7 - 4.0 K/uL   Monocytes Relative 5 %   Monocytes Absolute 0.3 0.1 - 1.0 K/uL   Eosinophils Relative 2 %   Eosinophils Absolute 0.1 0.0 - 0.5 K/uL   Basophils Relative 1 %   Basophils Absolute 0.1 0.0 - 0.1 K/uL   Immature Granulocytes 0 %   Abs Immature Granulocytes 0.01 0.00 - 0.07 K/uL    Comment: Performed at [facility], [address], [place], [place] [address]    RADIOGRAPHIC STUDIES: I have personally reviewed the radiological images as listed and agreed with the findings in the report. No results found.  ASSESSMENT:   Erythrocytosis: - Patient seen at the request of Dr. Theador Hawthorne for erythrocytosis. - He is known to have elevated hemoglobin and RBC count since 2016. - He is not on testosterone supplements or anabolic steroids.  No B symptoms.  No aquagenic pruritus or vasomotor symptoms. 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   Social/family history: - He works at [employer]).  Non-smoker. - No sickle cell or thalassemia in the family.  Maternal grandfather had colon cancer.  Maternal grandmother had cancer.   PLAN:  Erythrocytosis: -  We discussed secondary causes of erythrocytosis from hypoxia (COPD) as well as increased EPO production (liver and kidney disease).  We also discussed primary causes including polycythemia vera. - We will check serum erythropoietin and JAK2 V617F mutations. - He also has slight microcytosis with normal hemoglobin.  We will also check hemoglobin electrophoresis to evaluate for underlying sickle cell/thalassemia trait. - RTC 2 to 3 weeks for follow-up.    All questions were answered. The patient knows to call the clinic with any problems, questions or concerns.  Doreatha Massed, MD 09/03/21 3:13 PM  [facility] [phone]   I, Alda Ponder, am acting as a scribe for Dr. Doreatha Massed.  I, Doreatha Massed MD, have reviewed the above documentation for accuracy and completeness, and I agree with the above.

## 2021-09-03 NOTE — Patient Instructions (Addendum)
[facility] at [facility] Discharge Instructions  You were seen and examined today by Dr. Ellin Saba. Dr. Ellin Saba is a hematologist, meaning that he specializes in blood abnormalities. Dr. Ellin Saba discussed your past medical history, family history of cancers/blood conditions and the events that led to you being here today.  You were referred to Dr. Ellin Saba by Dr. Wolfgang Phoenix due to elevated blood counts (hemoglobin and hematocrit). Dr. Ellin Saba has recommended additional labs today in an attempt to identify the cause.  Follow-up as scheduled.   Thank you for choosing [facility] at [facility] to provide your oncology and hematology care.  To afford each patient quality time with our provider, please arrive at least 15 minutes before your scheduled appointment time.   If you have a lab appointment with the Cancer Center please come in thru the Main Entrance and check in at the main information desk.  You need to re-schedule your appointment should you arrive 10 or more minutes late.  We strive to give you quality time with our providers, and arriving late affects you and other patients whose appointments are after yours.  Also, if you no show three or more times for appointments you may be dismissed from the clinic at the providers discretion.     Again, thank you for choosing [facility].  Our hope is that these requests will decrease the amount of time that you wait before being seen by our physicians.       _____________________________________________________________  Should you have questions after your visit to [facility], please contact our office at [phone] and follow the prompts.  Our office hours are 8:00 a.m. and 4:30 p.m. Monday - Friday.  Please note that voicemails left after 4:00 p.m. may not be returned until the following business day.  We are closed weekends and major holidays.  You do have  access to a nurse 24-7, just call the main number to the clinic [phone] and do not press any options, hold on the line and a nurse will answer the phone.    For prescription refill requests, have your pharmacy contact our office and allow 72 hours.    Due to Covid, you will need to wear a mask upon entering the hospital. If you do not have a mask, a mask will be given to you at the Main Entrance upon arrival. For doctor visits, patients may have 1 support person age 59 or older with them. For treatment visits, patients can not have anyone with them due to social distancing guidelines and our immunocompromised population.

## 2021-09-04 LAB — ERYTHROPOIETIN: Erythropoietin: 12.7 m[IU]/mL (ref 2.6–18.5)

## 2021-09-08 LAB — HGB FRACTIONATION CASCADE
Hgb A2: 1.6 % — ABNORMAL LOW (ref 1.8–3.2)
Hgb A: 98.4 % (ref 96.4–98.8)
Hgb F: 0 % (ref 0.0–2.0)
Hgb S: 0 %

## 2021-09-10 LAB — JAK2 V617F RFX CALR/MPL/E12-15

## 2021-09-10 LAB — CALR +MPL + E12-E15  (REFLEX)

## 2021-09-23 NOTE — Progress Notes (Signed)
Ricardo [name], [place] [address]   CLINIC:  Medical Oncology/Hematology  PCP:  Celene Squibb, MD [place] [address] [phone]   REASON FOR VISIT:  Follow-up for erythrocytosis  PRIOR THERAPY: None   CURRENT THERAPY: Under work-up  INTERVAL HISTORY:  Ricardo [name] 31 y.o. male returns for routine follow-up of his erythrocytosis.  He was seen for initial consultation by Dr. Delton Coombes on 09/03/2021.  At today's visit, he reports feeling well.  He denies any changes in his symptoms or baseline health status since his visit with Dr. Delton Coombes 3 weeks ago.  He denies any prior history of DVT, PE, CVA, or MI.  He denies any aquagenic pruritus, Raynaud's phenomenon, erythromelalgia, or vasomotor symptoms.  No B symptoms such as fever, chills, night sweats, unintentional weight loss.  He denies any abdominal pain, nausea, or early satiety.  He denies any use of testosterone supplements or steroid tablets.  He is a non-smoker.  He does not have any known diagnosis of sleep apnea, but has been told that he snores.  He reports that he has an older brother with sleep apnea.  He has 100% energy and 100% appetite. He endorses that he is maintaining a stable weight.   REVIEW OF SYSTEMS:  Review of Systems  Constitutional:  Negative for appetite change, chills, diaphoresis, fatigue, fever and unexpected weight change.  HENT:   Negative for lump/mass and nosebleeds.   Eyes:  Negative for eye problems.  Respiratory:  Negative for cough, hemoptysis and shortness of breath.   Cardiovascular:  Negative for chest pain, leg swelling and palpitations.  Gastrointestinal:  Negative for abdominal pain, blood in stool, constipation, diarrhea, nausea and vomiting.  Genitourinary:  Negative for hematuria.   Skin: Negative.   Neurological:  Negative for dizziness, headaches and light-headedness.  Hematological:  Does not bruise/bleed easily.       PAST MEDICAL/SURGICAL HISTORY:  Past Medical History:  Diagnosis Date   Hypertension    Past Surgical History:  Procedure Laterality Date   LAPAROSCOPIC APPENDECTOMY N/A 01/26/2015   Procedure: APPENDECTOMY LAPAROSCOPIC;  Surgeon: Aviva Signs, MD;  Location: [facility];  Service: General;  Laterality: N/A;     SOCIAL HISTORY:  Social History   Socioeconomic History   Marital status: Married    Spouse name: Not on file   Number of children: Not on file   Years of education: Not on file   Highest education level: Not on file  Occupational History   Not on file  Tobacco Use   Smoking status: Never   Smokeless tobacco: Not on file  Substance and Sexual Activity   Alcohol use: Yes    Alcohol/week: 2.0 standard drinks of alcohol    Types: 2 Cans of beer per week    Comment: occasional   Drug use: No   Sexual activity: Yes  Other Topics Concern   Not on file  Social History Narrative   Not on file   Social Determinants of Health   Financial Resource Strain: Not on file  Food Insecurity: Not on file  Transportation Needs: Not on file  Physical Activity: Not on file  Stress: Not on file  Social Connections: Not on file  Intimate Partner Violence: Not on file    FAMILY HISTORY:  No family history on file.  CURRENT MEDICATIONS:  Outpatient Encounter Medications as of 09/24/2021  Medication Sig   atorvastatin (LIPITOR) 10 MG tablet Take 10 mg  by mouth daily.   enalapril (VASOTEC) 20 MG tablet Take 40 mg by mouth daily.    ergocalciferol (VITAMIN D2) 50000 units capsule Take 50,000 Units by mouth every Tuesday.   No facility-administered encounter medications on file as of 09/24/2021.    ALLERGIES:  No Known Allergies   PHYSICAL EXAM:  ECOG PERFORMANCE STATUS: 0 - Asymptomatic  There were no vitals filed for this visit. There were no vitals filed for this visit. Physical Exam Constitutional:      Appearance: Normal appearance.  HENT:     Head: Normocephalic  and atraumatic.     Comments: Large neck circumference noted on exam    Mouth/Throat:     Mouth: Mucous membranes are moist.  Eyes:     Extraocular Movements: Extraocular movements intact.     Pupils: Pupils are equal, round, and reactive to light.  Cardiovascular:     Rate and Rhythm: Normal rate and regular rhythm.     Pulses: Normal pulses.     Heart sounds: Normal heart sounds.  Pulmonary:     Effort: Pulmonary effort is normal.     Breath sounds: Normal breath sounds.  Abdominal:     General: Bowel sounds are normal.     Palpations: Abdomen is soft.     Tenderness: There is no abdominal tenderness.  Musculoskeletal:        General: No swelling.     Right lower leg: No edema.     Left lower leg: No edema.  Lymphadenopathy:     Cervical: No cervical adenopathy.  Skin:    General: Skin is warm and dry.  Neurological:     General: No focal deficit present.     Mental Status: He is alert and oriented to person, place, and time.  Psychiatric:        Mood and Affect: Mood normal.        Behavior: Behavior normal.      LABORATORY DATA:  I have reviewed the labs as listed.  CBC    Component Value Date/Time   WBC 5.4 09/03/2021 0927   RBC 6.71 (H) 09/03/2021 0927   HGB 17.5 (H) 09/03/2021 0927   HCT 52.7 (H) 09/03/2021 0927   PLT 283 09/03/2021 0927   MCV 78.5 (L) 09/03/2021 0927   MCH 26.1 09/03/2021 0927   MCHC 33.2 09/03/2021 0927   RDW 14.3 09/03/2021 0927   LYMPHSABS 1.3 09/03/2021 0927   MONOABS 0.3 09/03/2021 0927   EOSABS 0.1 09/03/2021 0927   BASOSABS 0.1 09/03/2021 0927      Latest Ref Rng & Units 01/27/2015    6:46 AM 01/26/2015   12:31 PM  CMP  Glucose 65 - 99 mg/dL 107  112   BUN 6 - 20 mg/dL 13  12   Creatinine 0.61 - 1.24 mg/dL 1.74  1.69   Sodium 135 - 145 mmol/L 138  141   Potassium 3.5 - 5.1 mmol/L 4.7  4.2   Chloride 101 - 111 mmol/L 103  102   CO2 22 - 32 mmol/L 28  30   Calcium 8.9 - 10.3 mg/dL 9.3  9.9     DIAGNOSTIC IMAGING:  I  have independently reviewed the relevant imaging and discussed with the patient.  ASSESSMENT & PLAN: 1.  Erythrocytosis - Patient seen at the request of Dr. Theador Hawthorne for erythrocytosis. - He is known to have elevated hemoglobin, hematocrit, and RBC count since 2016.  He has microcytosis with MCV in the upper 70s. -  He is not on testosterone supplements or anabolic steroids.  He is a non-smoker. - He does not have any known diagnosis of sleep apnea, but has been told that he snores.  He denies any daytime hypersomnolence.  Older brother has sleep apnea. - No known cardiopulmonary disease.   - No known carbon monoxide exposure.  Lives in an apartment with electric heating and appliances. -- He donates blood periodically, last was in 2016 - No B symptoms.  No aquagenic pruritus or vasomotor symptoms.  - No history of DVT, PE, CVA, or MI.   - Hematology work-up (09/03/2021): Normal erythropoietin 12.7.  No mutations of JAK2, CALR, MPL, or Exon 12-15. - CBC (09/03/2021): RBC 6.71, hemoglobin 17.5, hematocrit 52.7, MCV 78.5 - Hemoglobin fractionation cascade (09/03/2021): No obvious hemoglobin variant or beta thalassemia identified, but results may indicate alpha thalassemia (inconclusive) - Indeterminate cause erythrocytosis, additional work-up pending. - No clear indication for therapeutic phlebotomy at this time as he has HCT <54.0 and low symptom burden. - PLAN: We discussed referral for sleep study.  Patient declines this for the present, but we will continue to discuss at follow-up visits. - Recommend starting aspirin 81 mg daily due to erythrocytosis in the setting of hypertension and hyperlipidemia. - We will repeat CBC along with carbon monoxide and testosterone levels in 6 months. - RTC in 6 months, after labs - We discussed implications of possible alpha thalassemia trait, including reproductive implications.  Patient does not wish to pursue additional testing with alpha thalassemia DNA  testing. - If he has any major changes in his blood counts, we would possibly consider bone marrow biopsy to rule out triple negative polycythemia.  However, since erythropoietin is normal this is considered less likely.  2.  Other history - Patient has CKD from FSGS, follows with Dr. Theador Hawthorne - He works at [employer] ([employer] fund[employer]).  Non-smoker. - No sickle cell or thalassemia in the family.  Maternal grandfather had colon cancer.  Maternal grandmother had cancer.   PLAN SUMMARY & DISPOSITION: Labs in 6 months RTC 1 week after labs  All questions were answered. The patient knows to call the clinic with any problems, questions or concerns.  Medical decision making: Moderate  Time spent on visit: I spent 20 minutes counseling the patient face to face. The total time spent in the appointment was 30 minutes and more than 50% was on counseling.   Harriett Rush, PA-C  09/24/2021 10:02 AM

## 2021-09-24 ENCOUNTER — Inpatient Hospital Stay (HOSPITAL_COMMUNITY): Payer: BC Managed Care – PPO | Attending: Hematology | Admitting: Physician Assistant

## 2021-09-24 VITALS — BP 122/86 | HR 83 | Temp 98.8°F | Resp 18 | Ht 68.0 in | Wt 202.1 lb

## 2021-09-24 DIAGNOSIS — D751 Secondary polycythemia: Secondary | ICD-10-CM | POA: Insufficient documentation

## 2021-09-24 DIAGNOSIS — N189 Chronic kidney disease, unspecified: Secondary | ICD-10-CM | POA: Diagnosis not present

## 2021-09-24 DIAGNOSIS — Z79899 Other long term (current) drug therapy: Secondary | ICD-10-CM | POA: Diagnosis not present

## 2021-09-24 NOTE — Patient Instructions (Signed)
[facility] at [facility] Discharge Instructions  You were seen today by Rojelio Brenner PA-C for your elevated red blood cells (erythrocytosis).  As we discussed, we do not yet know exactly what is causing your blood counts to be elevated.  You may have some undiagnosed sleep apnea, which should be worked up by sleep study.  Per your request, we will hold off on referring you for sleep study at this time, but we will consider this again at future visits.  Recommend that you start taking ASPIRIN 81 mg ("baby aspirin") once daily to decrease your risk of blood clots, heart attack, and stroke related to your high red blood cell count, especially since you also have high cholesterol and high blood pressure.  You may also have a minor abnormality in your hemoglobin known as "alpha thalassemia," but we would need to perform further testing to confirm this.  Whether or not you have this trait would have little effect on your own health, but may have implications for reproductive plans for you and your wife.  If you would like further testing on this, please let us know.  We will check your labs and see you for follow-up visit in 6 months.    Thank you for choosing [facility] at [facility] to provide your oncology and hematology care.  To afford each patient quality time with our provider, please arrive at least 15 minutes before your scheduled appointment time.   If you have a lab appointment with the Cancer Center please come in thru the Main Entrance and check in at the main information desk.  You need to re-schedule your appointment should you arrive 10 or more minutes late.  We strive to give you quality time with our providers, and arriving late affects you and other patients whose appointments are after yours.  Also, if you no show three or more times for appointments you may be dismissed from the clinic at the providers discretion.     Again, thank  you for choosing [facility].  Our hope is that these requests will decrease the amount of time that you wait before being seen by our physicians.       _____________________________________________________________  Should you have questions after your visit to [facility], please contact our office at [phone] and follow the prompts.  Our office hours are 8:00 a.m. and 4:30 p.m. Monday - Friday.  Please note that voicemails left after 4:00 p.m. may not be returned until the following business day.  We are closed weekends and major holidays.  You do have access to a nurse 24-7, just call the main number to the clinic [phone] and do not press any options, hold on the line and a nurse will answer the phone.    For prescription refill requests, have your pharmacy contact our office and allow 72 hours.    Due to Covid, you will need to wear a mask upon entering the hospital. If you do not have a mask, a mask will be given to you at the Main Entrance upon arrival. For doctor visits, patients may have 1 support person age 43 or older with them. For treatment visits, patients can not have anyone with them due to social distancing guidelines and our immunocompromised population.

## 2021-10-15 DIAGNOSIS — R809 Proteinuria, unspecified: Secondary | ICD-10-CM | POA: Diagnosis not present

## 2021-10-15 DIAGNOSIS — I129 Hypertensive chronic kidney disease with stage 1 through stage 4 chronic kidney disease, or unspecified chronic kidney disease: Secondary | ICD-10-CM | POA: Diagnosis not present

## 2021-10-15 DIAGNOSIS — N1832 Chronic kidney disease, stage 3b: Secondary | ICD-10-CM | POA: Diagnosis not present

## 2021-10-15 DIAGNOSIS — N041 Nephrotic syndrome with focal and segmental glomerular lesions: Secondary | ICD-10-CM | POA: Diagnosis not present

## 2022-03-17 DIAGNOSIS — N1832 Chronic kidney disease, stage 3b: Secondary | ICD-10-CM | POA: Diagnosis not present

## 2022-03-18 DIAGNOSIS — N2581 Secondary hyperparathyroidism of renal origin: Secondary | ICD-10-CM | POA: Diagnosis not present

## 2022-03-18 DIAGNOSIS — I129 Hypertensive chronic kidney disease with stage 1 through stage 4 chronic kidney disease, or unspecified chronic kidney disease: Secondary | ICD-10-CM | POA: Diagnosis not present

## 2022-03-18 DIAGNOSIS — D751 Secondary polycythemia: Secondary | ICD-10-CM | POA: Diagnosis not present

## 2022-03-18 DIAGNOSIS — N1832 Chronic kidney disease, stage 3b: Secondary | ICD-10-CM | POA: Diagnosis not present

## 2022-03-25 ENCOUNTER — Inpatient Hospital Stay: Payer: BC Managed Care – PPO | Attending: Hematology

## 2022-04-01 ENCOUNTER — Ambulatory Visit: Payer: BC Managed Care – PPO | Admitting: Physician Assistant

## 2022-06-17 DIAGNOSIS — N041 Nephrotic syndrome with focal and segmental glomerular lesions: Secondary | ICD-10-CM | POA: Diagnosis not present

## 2022-06-17 DIAGNOSIS — D751 Secondary polycythemia: Secondary | ICD-10-CM | POA: Diagnosis not present

## 2022-06-17 DIAGNOSIS — I129 Hypertensive chronic kidney disease with stage 1 through stage 4 chronic kidney disease, or unspecified chronic kidney disease: Secondary | ICD-10-CM | POA: Diagnosis not present

## 2022-06-17 DIAGNOSIS — N1832 Chronic kidney disease, stage 3b: Secondary | ICD-10-CM | POA: Diagnosis not present

## 2022-06-24 DIAGNOSIS — N2581 Secondary hyperparathyroidism of renal origin: Secondary | ICD-10-CM | POA: Diagnosis not present

## 2022-06-24 DIAGNOSIS — N1832 Chronic kidney disease, stage 3b: Secondary | ICD-10-CM | POA: Diagnosis not present

## 2022-06-24 DIAGNOSIS — I129 Hypertensive chronic kidney disease with stage 1 through stage 4 chronic kidney disease, or unspecified chronic kidney disease: Secondary | ICD-10-CM | POA: Diagnosis not present

## 2022-09-23 DIAGNOSIS — R809 Proteinuria, unspecified: Secondary | ICD-10-CM | POA: Diagnosis not present

## 2022-09-23 DIAGNOSIS — D631 Anemia in chronic kidney disease: Secondary | ICD-10-CM | POA: Diagnosis not present

## 2022-09-23 DIAGNOSIS — N183 Chronic kidney disease, stage 3 unspecified: Secondary | ICD-10-CM | POA: Diagnosis not present

## 2022-09-30 DIAGNOSIS — N269 Renal sclerosis, unspecified: Secondary | ICD-10-CM | POA: Diagnosis not present

## 2022-09-30 DIAGNOSIS — D751 Secondary polycythemia: Secondary | ICD-10-CM | POA: Diagnosis not present

## 2022-09-30 DIAGNOSIS — N1832 Chronic kidney disease, stage 3b: Secondary | ICD-10-CM | POA: Diagnosis not present

## 2022-09-30 DIAGNOSIS — I129 Hypertensive chronic kidney disease with stage 1 through stage 4 chronic kidney disease, or unspecified chronic kidney disease: Secondary | ICD-10-CM | POA: Diagnosis not present

## 2022-12-30 DIAGNOSIS — I129 Hypertensive chronic kidney disease with stage 1 through stage 4 chronic kidney disease, or unspecified chronic kidney disease: Secondary | ICD-10-CM | POA: Diagnosis not present

## 2022-12-30 DIAGNOSIS — N1832 Chronic kidney disease, stage 3b: Secondary | ICD-10-CM | POA: Diagnosis not present

## 2022-12-30 DIAGNOSIS — N269 Renal sclerosis, unspecified: Secondary | ICD-10-CM | POA: Diagnosis not present

## 2022-12-30 DIAGNOSIS — N189 Chronic kidney disease, unspecified: Secondary | ICD-10-CM | POA: Diagnosis not present

## 2023-01-06 DIAGNOSIS — N269 Renal sclerosis, unspecified: Secondary | ICD-10-CM | POA: Diagnosis not present

## 2023-01-06 DIAGNOSIS — N1832 Chronic kidney disease, stage 3b: Secondary | ICD-10-CM | POA: Diagnosis not present

## 2023-01-06 DIAGNOSIS — I129 Hypertensive chronic kidney disease with stage 1 through stage 4 chronic kidney disease, or unspecified chronic kidney disease: Secondary | ICD-10-CM | POA: Diagnosis not present

## 2023-01-06 DIAGNOSIS — D751 Secondary polycythemia: Secondary | ICD-10-CM | POA: Diagnosis not present

## 2023-04-06 DIAGNOSIS — R809 Proteinuria, unspecified: Secondary | ICD-10-CM | POA: Diagnosis not present

## 2023-04-06 DIAGNOSIS — N189 Chronic kidney disease, unspecified: Secondary | ICD-10-CM | POA: Diagnosis not present

## 2023-04-06 DIAGNOSIS — D631 Anemia in chronic kidney disease: Secondary | ICD-10-CM | POA: Diagnosis not present

## 2023-04-14 DIAGNOSIS — N1832 Chronic kidney disease, stage 3b: Secondary | ICD-10-CM | POA: Diagnosis not present

## 2023-04-14 DIAGNOSIS — I129 Hypertensive chronic kidney disease with stage 1 through stage 4 chronic kidney disease, or unspecified chronic kidney disease: Secondary | ICD-10-CM | POA: Diagnosis not present

## 2023-04-14 DIAGNOSIS — D751 Secondary polycythemia: Secondary | ICD-10-CM | POA: Diagnosis not present

## 2023-04-14 DIAGNOSIS — N269 Renal sclerosis, unspecified: Secondary | ICD-10-CM | POA: Diagnosis not present

## 2023-06-16 DIAGNOSIS — E211 Secondary hyperparathyroidism, not elsewhere classified: Secondary | ICD-10-CM | POA: Diagnosis not present

## 2023-06-16 DIAGNOSIS — D631 Anemia in chronic kidney disease: Secondary | ICD-10-CM | POA: Diagnosis not present

## 2023-06-16 DIAGNOSIS — E559 Vitamin D deficiency, unspecified: Secondary | ICD-10-CM | POA: Diagnosis not present

## 2023-06-16 DIAGNOSIS — N189 Chronic kidney disease, unspecified: Secondary | ICD-10-CM | POA: Diagnosis not present

## 2023-06-16 DIAGNOSIS — R809 Proteinuria, unspecified: Secondary | ICD-10-CM | POA: Diagnosis not present

## 2023-06-23 DIAGNOSIS — N1832 Chronic kidney disease, stage 3b: Secondary | ICD-10-CM | POA: Diagnosis not present

## 2023-06-23 DIAGNOSIS — N269 Renal sclerosis, unspecified: Secondary | ICD-10-CM | POA: Diagnosis not present

## 2023-06-23 DIAGNOSIS — D751 Secondary polycythemia: Secondary | ICD-10-CM | POA: Diagnosis not present

## 2023-09-15 DIAGNOSIS — Z7689 Persons encountering health services in other specified circumstances: Secondary | ICD-10-CM | POA: Diagnosis not present

## 2023-09-15 DIAGNOSIS — N189 Chronic kidney disease, unspecified: Secondary | ICD-10-CM | POA: Diagnosis not present

## 2023-09-15 DIAGNOSIS — I129 Hypertensive chronic kidney disease with stage 1 through stage 4 chronic kidney disease, or unspecified chronic kidney disease: Secondary | ICD-10-CM | POA: Diagnosis not present

## 2023-09-15 DIAGNOSIS — I10 Essential (primary) hypertension: Secondary | ICD-10-CM | POA: Diagnosis not present

## 2023-09-15 DIAGNOSIS — E785 Hyperlipidemia, unspecified: Secondary | ICD-10-CM | POA: Diagnosis not present

## 2023-11-06 DIAGNOSIS — D631 Anemia in chronic kidney disease: Secondary | ICD-10-CM | POA: Diagnosis not present

## 2023-11-06 DIAGNOSIS — R809 Proteinuria, unspecified: Secondary | ICD-10-CM | POA: Diagnosis not present

## 2023-11-06 DIAGNOSIS — N189 Chronic kidney disease, unspecified: Secondary | ICD-10-CM | POA: Diagnosis not present

## 2023-11-10 DIAGNOSIS — D751 Secondary polycythemia: Secondary | ICD-10-CM | POA: Diagnosis not present

## 2023-11-10 DIAGNOSIS — N1832 Chronic kidney disease, stage 3b: Secondary | ICD-10-CM | POA: Diagnosis not present

## 2023-11-10 DIAGNOSIS — N269 Renal sclerosis, unspecified: Secondary | ICD-10-CM | POA: Diagnosis not present

## 2023-11-10 DIAGNOSIS — I129 Hypertensive chronic kidney disease with stage 1 through stage 4 chronic kidney disease, or unspecified chronic kidney disease: Secondary | ICD-10-CM | POA: Diagnosis not present

## 2023-11-17 DIAGNOSIS — I129 Hypertensive chronic kidney disease with stage 1 through stage 4 chronic kidney disease, or unspecified chronic kidney disease: Secondary | ICD-10-CM | POA: Diagnosis not present

## 2023-11-17 DIAGNOSIS — E785 Hyperlipidemia, unspecified: Secondary | ICD-10-CM | POA: Diagnosis not present

## 2023-11-17 DIAGNOSIS — N189 Chronic kidney disease, unspecified: Secondary | ICD-10-CM | POA: Diagnosis not present

## 2023-11-17 DIAGNOSIS — Z0000 Encounter for general adult medical examination without abnormal findings: Secondary | ICD-10-CM | POA: Diagnosis not present

## 2023-11-17 DIAGNOSIS — Z0001 Encounter for general adult medical examination with abnormal findings: Secondary | ICD-10-CM | POA: Diagnosis not present

## 2023-11-17 DIAGNOSIS — R002 Palpitations: Secondary | ICD-10-CM | POA: Diagnosis not present

## 2023-11-17 DIAGNOSIS — I10 Essential (primary) hypertension: Secondary | ICD-10-CM | POA: Diagnosis not present

## 2024-02-09 DIAGNOSIS — D631 Anemia in chronic kidney disease: Secondary | ICD-10-CM | POA: Diagnosis not present

## 2024-02-09 DIAGNOSIS — N189 Chronic kidney disease, unspecified: Secondary | ICD-10-CM | POA: Diagnosis not present

## 2024-02-09 DIAGNOSIS — R809 Proteinuria, unspecified: Secondary | ICD-10-CM | POA: Diagnosis not present
